# Patient Record
Sex: Female | Born: 1980 | Race: White | Hispanic: No | Marital: Married | Smoking: Never smoker
Health system: Southern US, Community
[De-identification: ages and names within clinical notes are randomized; demographics above are authoritative.]

## PROBLEM LIST (undated history)

## (undated) ENCOUNTER — Inpatient Hospital Stay (HOSPITAL_COMMUNITY): Payer: Self-pay

## (undated) DIAGNOSIS — I471 Supraventricular tachycardia, unspecified: Secondary | ICD-10-CM

## (undated) DIAGNOSIS — G809 Cerebral palsy, unspecified: Secondary | ICD-10-CM

## (undated) HISTORY — DX: Cerebral palsy, unspecified: G80.9

## (undated) HISTORY — PX: NO PAST SURGERIES: SHX2092

---

## 2013-04-07 ENCOUNTER — Emergency Department (HOSPITAL_COMMUNITY)
Admission: EM | Admit: 2013-04-07 | Discharge: 2013-04-08 | Disposition: A | Discharge status: Home / Self Care | Payer: 59 | Attending: Emergency Medicine | Admitting: Emergency Medicine

## 2013-04-07 ENCOUNTER — Emergency Department (HOSPITAL_COMMUNITY): Payer: 59

## 2013-04-07 ENCOUNTER — Encounter (HOSPITAL_COMMUNITY): Payer: Self-pay

## 2013-04-07 DIAGNOSIS — R002 Palpitations: Secondary | ICD-10-CM | POA: Insufficient documentation

## 2013-04-07 DIAGNOSIS — I471 Supraventricular tachycardia: Secondary | ICD-10-CM

## 2013-04-07 DIAGNOSIS — I498 Other specified cardiac arrhythmias: Secondary | ICD-10-CM | POA: Insufficient documentation

## 2013-04-07 HISTORY — DX: Supraventricular tachycardia, unspecified: I47.10

## 2013-04-07 HISTORY — DX: Supraventricular tachycardia: I47.1

## 2013-04-07 LAB — CBC WITH DIFFERENTIAL/PLATELET
Basophils Relative: 1 % (ref 0–1)
Eosinophils Absolute: 0.1 10*3/uL (ref 0.0–0.7)
MCH: 29.4 pg (ref 26.0–34.0)
MCHC: 33.8 g/dL (ref 30.0–36.0)
Neutrophils Relative %: 63 % (ref 43–77)
Platelets: 371 10*3/uL (ref 150–400)
RDW: 13.4 % (ref 11.5–15.5)

## 2013-04-07 LAB — BASIC METABOLIC PANEL
BUN: 14 mg/dL (ref 6–23)
GFR calc Af Amer: >90 mL/min (ref >90)
GFR calc non Af Amer: >90 mL/min (ref >90)
Potassium: 3.6 mEq/L (ref 3.5–5.1)
Sodium: 137 mEq/L (ref 135–145)

## 2013-04-07 LAB — POCT I-STAT TROPONIN I: Troponin i, poc: 0 ng/mL (ref 0.00–0.08)

## 2013-04-07 NOTE — ED Provider Notes (Signed)
CSN: [id number]     Arrival date & time 04/07/13  2113 History     First MD Initiated Contact with Patient 04/07/13 2116     Chief Complaint  Patient presents with  . Tachycardia   (Consider location/radiation/quality/duration/timing/severity/associated sxs/prior Treatment) The history is provided by the patient and medical records.   Pt presents to the ED via EMS following episode of elevated HR at 220.  Pt states she has these episodes intermittent, variable in length, without specific triggers.  This episode occurred while doing laundry at home and lasted approx 40 minutes with some mild lightheadedness.  No associated chest pain, SOB, nausea, vomiting, or diaphoresis.  Last episode prior to this one was approx 2 months ago and occurred wile running.  Pt recent relocated here and does not have a cardiologist in the area, but she did see one years ago at [facility].  She wore a cardiac monitor but had no recorded events.  Did recently travel-- pt just returned home yesterday from a recent road trip to [place] visiting family.  Pt also takes OCPs.  No prior hx of DVT or PE.  Past Medical History  Diagnosis Date  . SVT (supraventricular tachycardia)    No past surgical history on file. No family history on file. History  Substance Use Topics  . Smoking status: Not on file  . Smokeless tobacco: Not on file  . Alcohol Use: Not on file   OB History   Grav Para Term Preterm Abortions TAB SAB Ect Mult Living                 Review of Systems  Cardiovascular: Positive for palpitations.  All other systems reviewed and are negative.    Allergies  Review of patient's allergies indicates no known allergies.  Home Medications   Current Outpatient Rx  Name  Route  Sig  Dispense  Refill  . norethindrone-ethinyl estradiol-iron (ESTROSTEP FE,TILIA FE,TRI-LEGEST FE) 1-20/1-30/1-35 MG-MCG tablet   Oral   Take 1 tablet by mouth daily.          SpO2 99%  LMP 03/21/2013  Physical  Exam  Nursing note and vitals reviewed. Constitutional: She is oriented to person, place, and time. She appears well-developed and well-nourished. No distress.  HENT:  Head: Normocephalic and atraumatic.  Mouth/Throat: Oropharynx is clear and moist.  Eyes: Conjunctivae and EOM are normal. Pupils are equal, round, and reactive to light.  Neck: Normal range of motion. Neck supple.  Cardiovascular: Normal rate, regular rhythm and normal heart sounds.   No murmur heard. Pulmonary/Chest: Effort normal and breath sounds normal. No respiratory distress. She has no wheezes.  Musculoskeletal: Normal range of motion. She exhibits no edema.  Neurological: She is alert and oriented to person, place, and time.  Skin: Skin is warm. She is not diaphoretic.  Psychiatric: She has a normal mood and affect.    ED Course   Procedures (including critical care time)   Date: 04/07/2013  Rate: 95  Rhythm: normal sinus rhythm  QRS Axis: normal  Intervals: PR shortened  ST/T Wave abnormalities: normal  Conduction Disutrbances:none  Narrative Interpretation:   Old EKG Reviewed: none available   Labs Reviewed  CBC WITH DIFFERENTIAL  BASIC METABOLIC PANEL  D-DIMER, QUANTITATIVE  POCT I-STAT TROPONIN I   Dg Chest 2 View  04/07/2013   *RADIOLOGY REPORT*  Clinical Data: Tachycardia, shortness of breath  CHEST - 2 VIEW  Comparison: None.  Findings: The cardiac and mediastinal silhouettes are within normal  limits.  The lungs are normally inflated.  No airspace consolidation, pulmonary edema, pleural effusion, or pneumothorax is identified.  No osseous abnormality.  IMPRESSION:  Normal radiograph of the chest with no acute cardiopulmonary process identified.   Original Report Authenticated By: Rise Mu, M.D.   1. SVT (supraventricular tachycardia)     MDM   EKG NSR, minimally shortened PR interval at 118.  Trop negative.  CXR clear.  Labs all WNL.  Rhythm strip from EMS reviewed-- episode of  SVT.  These are likely the episodes pt has been feeling for the past several months.  I doubt ACS, PE, dissection, or other vascular compromise.  I have discussed results of labs tests and imaging with pt.  I feel she can be safely d/c with close cardiology FU-- will be referred to [facility] cardiology.  I have discussed this plan with pt, she acknowledges understanding and is comfortable with plan.  Return precautions advised.  Discussed with Dr. Juleen China who agrees with assessment and plan.  Garlon Hatchet, PA-C 04/08/13 [phone]

## 2013-04-07 NOTE — ED Notes (Signed)
Pt had episode of HR of 220 EMS and resure had pt do val salva man. And converted back to NSR. Pt alert and oriented on arrival to ED

## 2013-04-07 NOTE — ED Notes (Signed)
Pt states she has had 3 other episode in the past. Has seen a cariologist in the past but just moved here and does not know any one

## 2013-04-07 NOTE — ED Notes (Signed)
Denies and pain or shob states she feels back to normal.

## 2013-04-12 NOTE — ED Provider Notes (Signed)
Medical screening examination/treatment/procedure(s) were conducted as a shared visit with non-physician practitioner(s) and myself.  I personally evaluated the patient during the encounter.  Patient with palpitations. She has been previously evaluated without clear etiology. 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 This resolved with vagal maneuvers performed by EMS. Workup today has been unremarkable. Return precautions discussed. Cardiology followup.  Raeford Razor, MD 04/12/13 1145

## 2013-06-13 ENCOUNTER — Ambulatory Visit (INDEPENDENT_AMBULATORY_CARE_PROVIDER_SITE_OTHER): Payer: 59 | Admitting: Cardiovascular Disease

## 2013-06-13 ENCOUNTER — Encounter: Payer: Self-pay | Admitting: Cardiovascular Disease

## 2013-06-13 VITALS — BP 126/82 | HR 92 | Ht 64.0 in | Wt 112.8 lb

## 2013-06-13 DIAGNOSIS — I471 Supraventricular tachycardia: Secondary | ICD-10-CM | POA: Insufficient documentation

## 2013-06-13 DIAGNOSIS — I498 Other specified cardiac arrhythmias: Secondary | ICD-10-CM

## 2013-06-13 MED ORDER — PROPRANOLOL HCL 10 MG PO TABS: 10.0000 mg | ORAL_TABLET | Freq: Three times a day (TID) | ORAL | Status: DC

## 2013-06-13 NOTE — Progress Notes (Signed)
Patient ID: Debra King, female   DOB: 1981/04/29, 32 y.o.   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  Started in her 20's  Initially lasted minutes but over years getting longer.  Seen in ER 8/14 with presumed SVT per EMS field strip rate 220 bpm.  Broke in field with vagal maneuvers.  Never had chest pain dyspnea or syncope.  No pre-excitation  Has been seen at [facility] before and indicates normal thyroid and echo within the last year.  Only one cup of coffee / day.  Exercise does not exacerbate Can get episodes that wake her up at night. Had another episode in September  Can last close to an hour now.  No ETOH drugs or other stimulants.  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  All other systems reviewed and negative   General: Affect appropriate Healthy:  appears stated age HEENT: normal Neck supple with no adenopathy JVP normal no bruits no thyromegaly Lungs clear with no wheezing and good diaphragmatic motion Heart:  S1/S2 no murmur,rub, gallop or click PMI normal Abdomen: benighn, BS positve, no tenderness, no AAA no bruit.  No HSM or HJR Distal pulses intact with no bruits No edema Neuro non-focal Skin warm and dry No muscular weakness  Medications Current Outpatient Prescriptions  Medication Sig Dispense Refill  . norethindrone-ethinyl estradiol-iron (ESTROSTEP FE,TILIA FE,TRI-LEGEST FE) 1-20/1-30/1-35 MG-MCG tablet Take 1 tablet by mouth daily.      . propranolol (INDERAL) 10 MG tablet Take 1 tablet (10 mg total) by mouth 3 (three) times daily.  30 tablet  3   No current facility-administered medications for this visit.    Allergies Review of patient's allergies indicates no known allergies.  Family History: History reviewed. No  pertinent family history.  Social History: History   Social History  . Marital Status: Single    Spouse Name: N/A    Number of Children: N/A  . Years of Education: N/A   Occupational History  . Not on file.   Social History Main Topics  . Smoking status: Never Smoker   . Smokeless tobacco: Not on file  . Alcohol Use: Not on file  . Drug Use: No  . Sexual Activity: Not on file   Other Topics Concern  . Not on file   Social History Narrative  . No narrative on file    Electrocardiogram:  SR rate 92 normal ECG PR 114  Assessment and Plan

## 2013-06-13 NOTE — Patient Instructions (Addendum)
Your physician recommends that you schedule a follow-up appointment in: EP   NEXT AVAILABLE    DX  SVT YEAR WITH DR [facility] Your physician has recommended you make the following change in your medication:  INDERAL  10 MG  AS  NEEDED FOR PALPITATIONS

## 2013-06-13 NOTE — Assessment & Plan Note (Signed)
Discussed vagal maneuvers PRN inderal for attacks  Will refer to EP for further discussion She is not inconvenienced enough to want ablation at this point

## 2013-07-03 ENCOUNTER — Encounter: Payer: Self-pay | Admitting: Internal Medicine

## 2013-07-04 ENCOUNTER — Encounter: Payer: Self-pay | Admitting: Internal Medicine

## 2013-07-04 ENCOUNTER — Ambulatory Visit (INDEPENDENT_AMBULATORY_CARE_PROVIDER_SITE_OTHER): Payer: 59 | Admitting: Internal Medicine

## 2013-07-04 VITALS — BP 118/82 | HR 74 | Ht 63.0 in | Wt 115.4 lb

## 2013-07-04 DIAGNOSIS — I471 Supraventricular tachycardia: Secondary | ICD-10-CM

## 2013-07-04 DIAGNOSIS — I498 Other specified cardiac arrhythmias: Secondary | ICD-10-CM

## 2013-07-04 NOTE — Progress Notes (Signed)
ELECTROPHYSIOLOGY CONSULT NOTE  Patient ID: Debra King, MRN: [id number], DOB/AGE: 01-23-1981 32 y.o. Admit date: (Not on file) Date of Consult: 07/04/2013  Primary Physician: No PCP Per Patient Primary Cardiologist: PN  Chief Complaint: SVT   HPI Debra King is a 32 y.o. 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 These have responded to Valsalva. It has been recorded on one occasion with EMS. We will work on getting these strips.  She was seen at [facility] a year ago. An echo at that time was apparently normal.  She has noted no association to stimulants. They have been triggered during exercise as well as while sleeping.      Past Medical History  Diagnosis Date  . SVT (supraventricular tachycardia)     Started in her 20s  . Cerebral palsy     Involving left leg and arm      Surgical History: No past surgical history on file.   Home Meds: Prior to Admission medications   Medication Sig Start Date End Date Taking? Authorizing Provider  levonorgestrel-ethinyl estradiol (AVIANE,ALESSE,LESSINA) 0.1-20 MG-MCG tablet Take 1 tablet by mouth daily.   Yes Historical Provider, MD  propranolol (INDERAL) 10 MG tablet Take 10 mg by mouth as needed. 06/13/13  Yes Wendall Stade, MD      Allergies: No Known Allergies  History   Social History  . Marital Status: Single    Spouse Name: N/A    Number of Children: N/A  . Years of Education: N/A   Occupational History  . Not on file.   Social History Main Topics  . Smoking status: Never Smoker   . Smokeless tobacco: Not on file  . Alcohol Use: No  . Drug Use: No  . Sexual Activity: Not on file   Other Topics Concern  . Not on file   Social History Narrative  . No narrative on file     No family history on file.   ROS:  Please see the history of present  illness.   Negative except History of GI bleed  All other systems reviewed and negative.    Physical Exam:   Blood pressure 118/82, pulse 74, height 5\' 3"  (1.6 m), weight 115 lb 6.4 oz (52.345 kg), last menstrual period 07/03/2013. General: Well developed, well nourished female in no acute distress. Head: Normocephalic, atraumatic, sclera non-icteric, no xanthomas, nares are without discharge. EENT: normal Lymph Nodes:  none Back: without scoliosis/kyphosis , no CVA tendersness Neck: Negative for carotid bruits. JVD not elevated. Lungs: Clear bilaterally to auscultation without wheezes, rales, or rhonchi. Breathing is unlabored. Heart: RRR with loud  S1 S2. No  murmur , rubs, or gallops appreciated. Abdomen: Soft, non-tender, non-distended with normoactive bowel sounds. No hepatomegaly. No rebound/guarding. No obvious abdominal masses. Msk:  Strength and tone appear normal for age. Significant asymmetry with atrophy of the left arm particularly Extremities: No clubbing or cyanosis. No edema.  Distal pedal pulses are 2+ and equal bilaterally. Skin: Warm and Dry Neuro: Alert and oriented X 3. CN III-XII intact Grossly normal sensory and motor function . Psych:  Responds to questions appropriately with a normal affect.      Labs: Cardiac Enzymes No results found for this basename: CKTOTAL, CKMB, TROPONINI,  in the last 72 hours CBC Lab Results  Component Value Date   WBC 10.3 04/07/2013  HGB 13.3 04/07/2013   HCT 39.3 04/07/2013   MCV 86.9 04/07/2013   PLT 371 04/07/2013   PROTIME: No results found for this basename: LABPROT, INR,  in the last 72 hours Chemistry No results found for this basename: NA, K, CL, CO2, BUN, CREATININE, CALCIUM, LABALBU, PROT, BILITOT, ALKPHOS, ALT, AST, GLUCOSE,  in the last 168 hours Lipids No results found for this basename: CHOL,  HDL,  LDLCALC,  TRIG   BNP No results found for this basename: probnp   Miscellaneous Lab Results  Component Value Date    DDIMER <0.27 04/07/2013    Radiology/Studies:  No results found.  EKG:  No evidence of ventricular preexcitation There is evidence of an R. prime in lead V1 in sinus rhythm   Assessment and Plan:    Debra King

## 2013-07-04 NOTE — Assessment & Plan Note (Signed)
The patient has recurrent SVT. We will work on getting distress. By history he could either be AV reentry or AV nodal reentry. We discussed treatment options including when necessary beta blockers, daily medications, the use of adjunctive parasympathetic maneuvers as well as catheter ablation. We reviewed benefits and risks of the procedure including but not limited to stroke, death, and heart block.  For now she would like to do more research. We will continue her on when necessary beta blockers which were initiated by Dr. Jamse Mead. We will see her in 8 months time unless she would like to be seen earlier

## 2013-07-04 NOTE — Patient Instructions (Signed)
Your physician wants you to follow-up in: 8 months with Dr. Graciela Husbands.  You will receive a reminder letter in the mail two months in advance. If you don't receive a letter, please call our office to schedule the follow-up appointment.

## 2014-04-07 IMAGING — CR DG CHEST 2V
2 series · 2 of 2 positions shown · non-contrast
Comparison: None.

CLINICAL DATA: Tachycardia, shortness of breath

CHEST - 2 VIEW

[w chest pa]
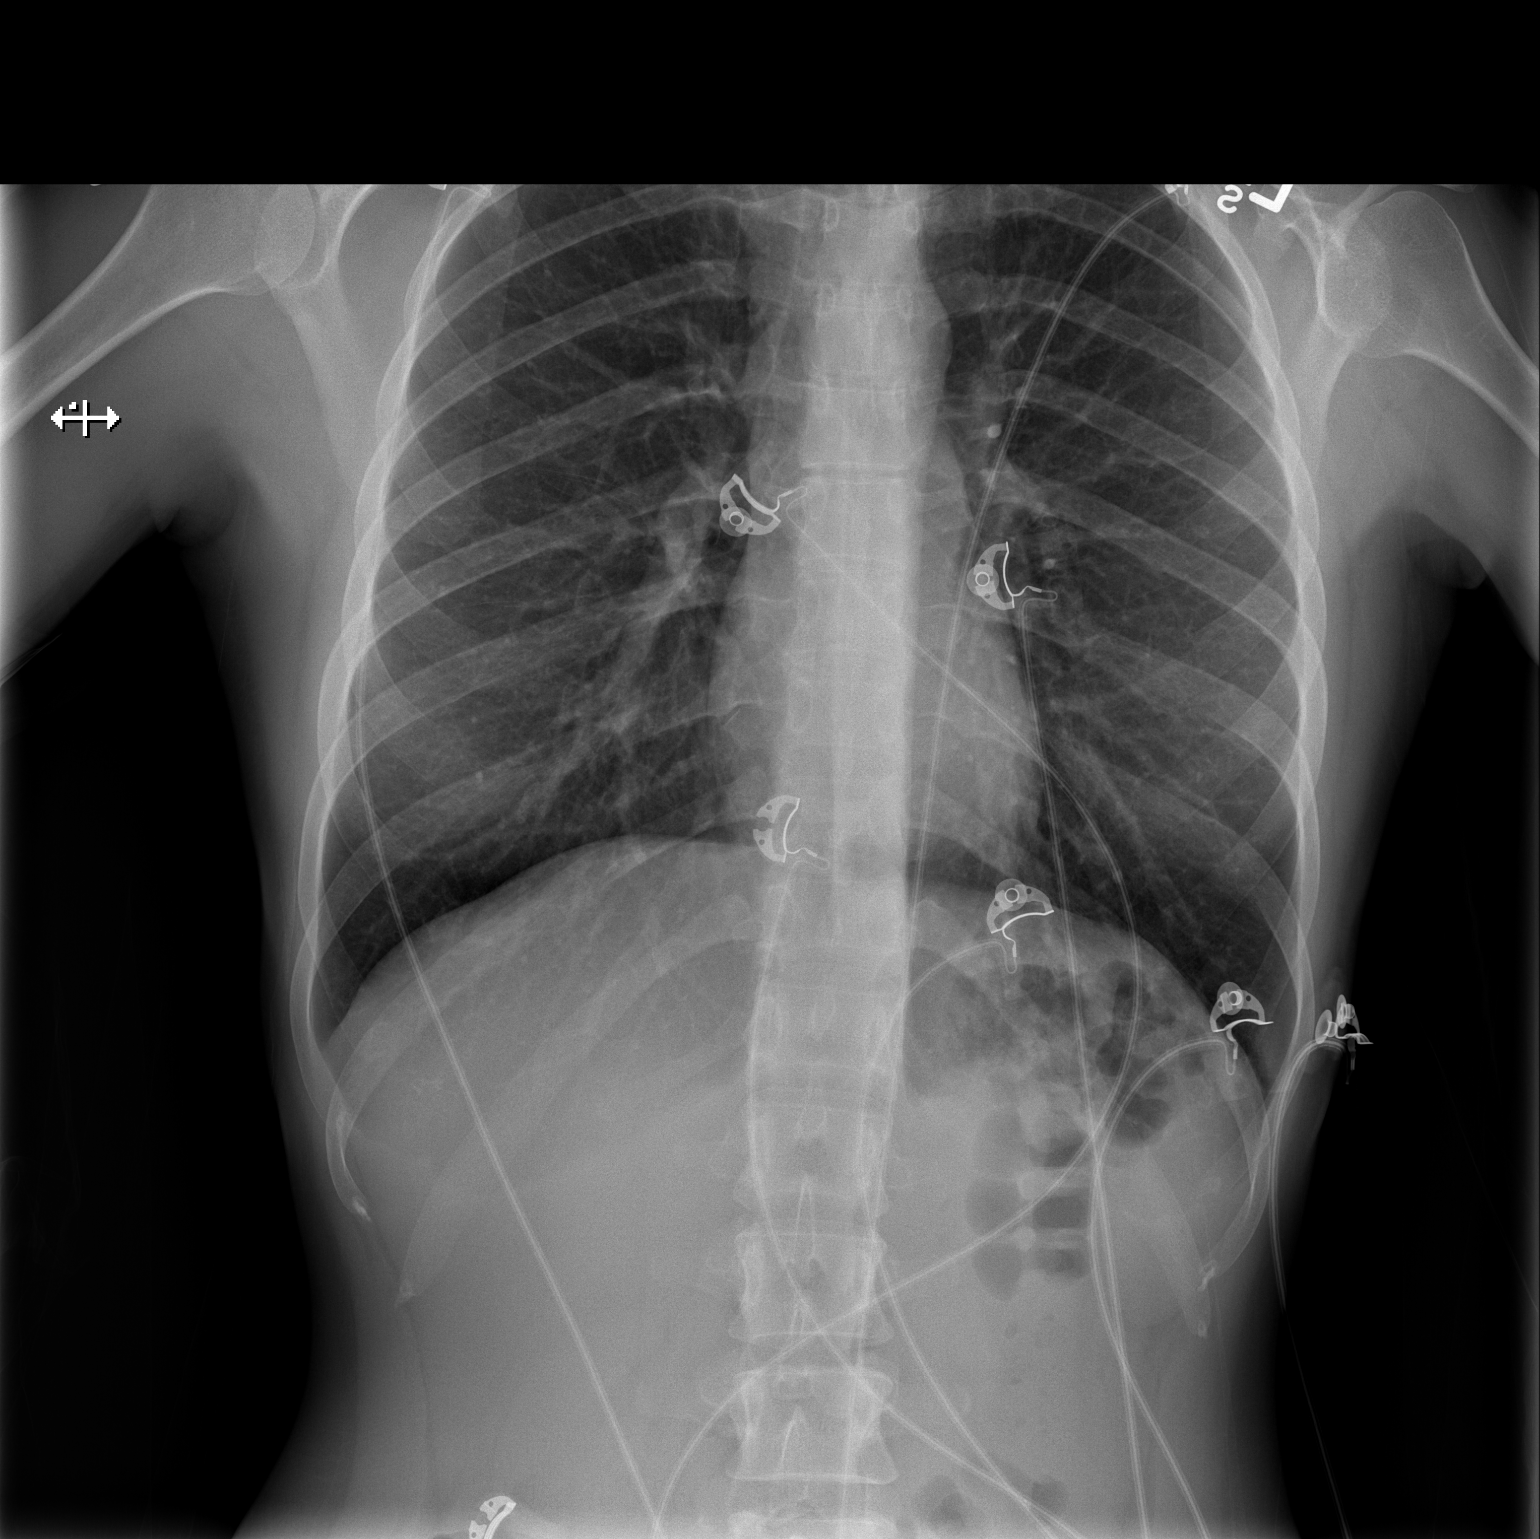

[w chest lat]
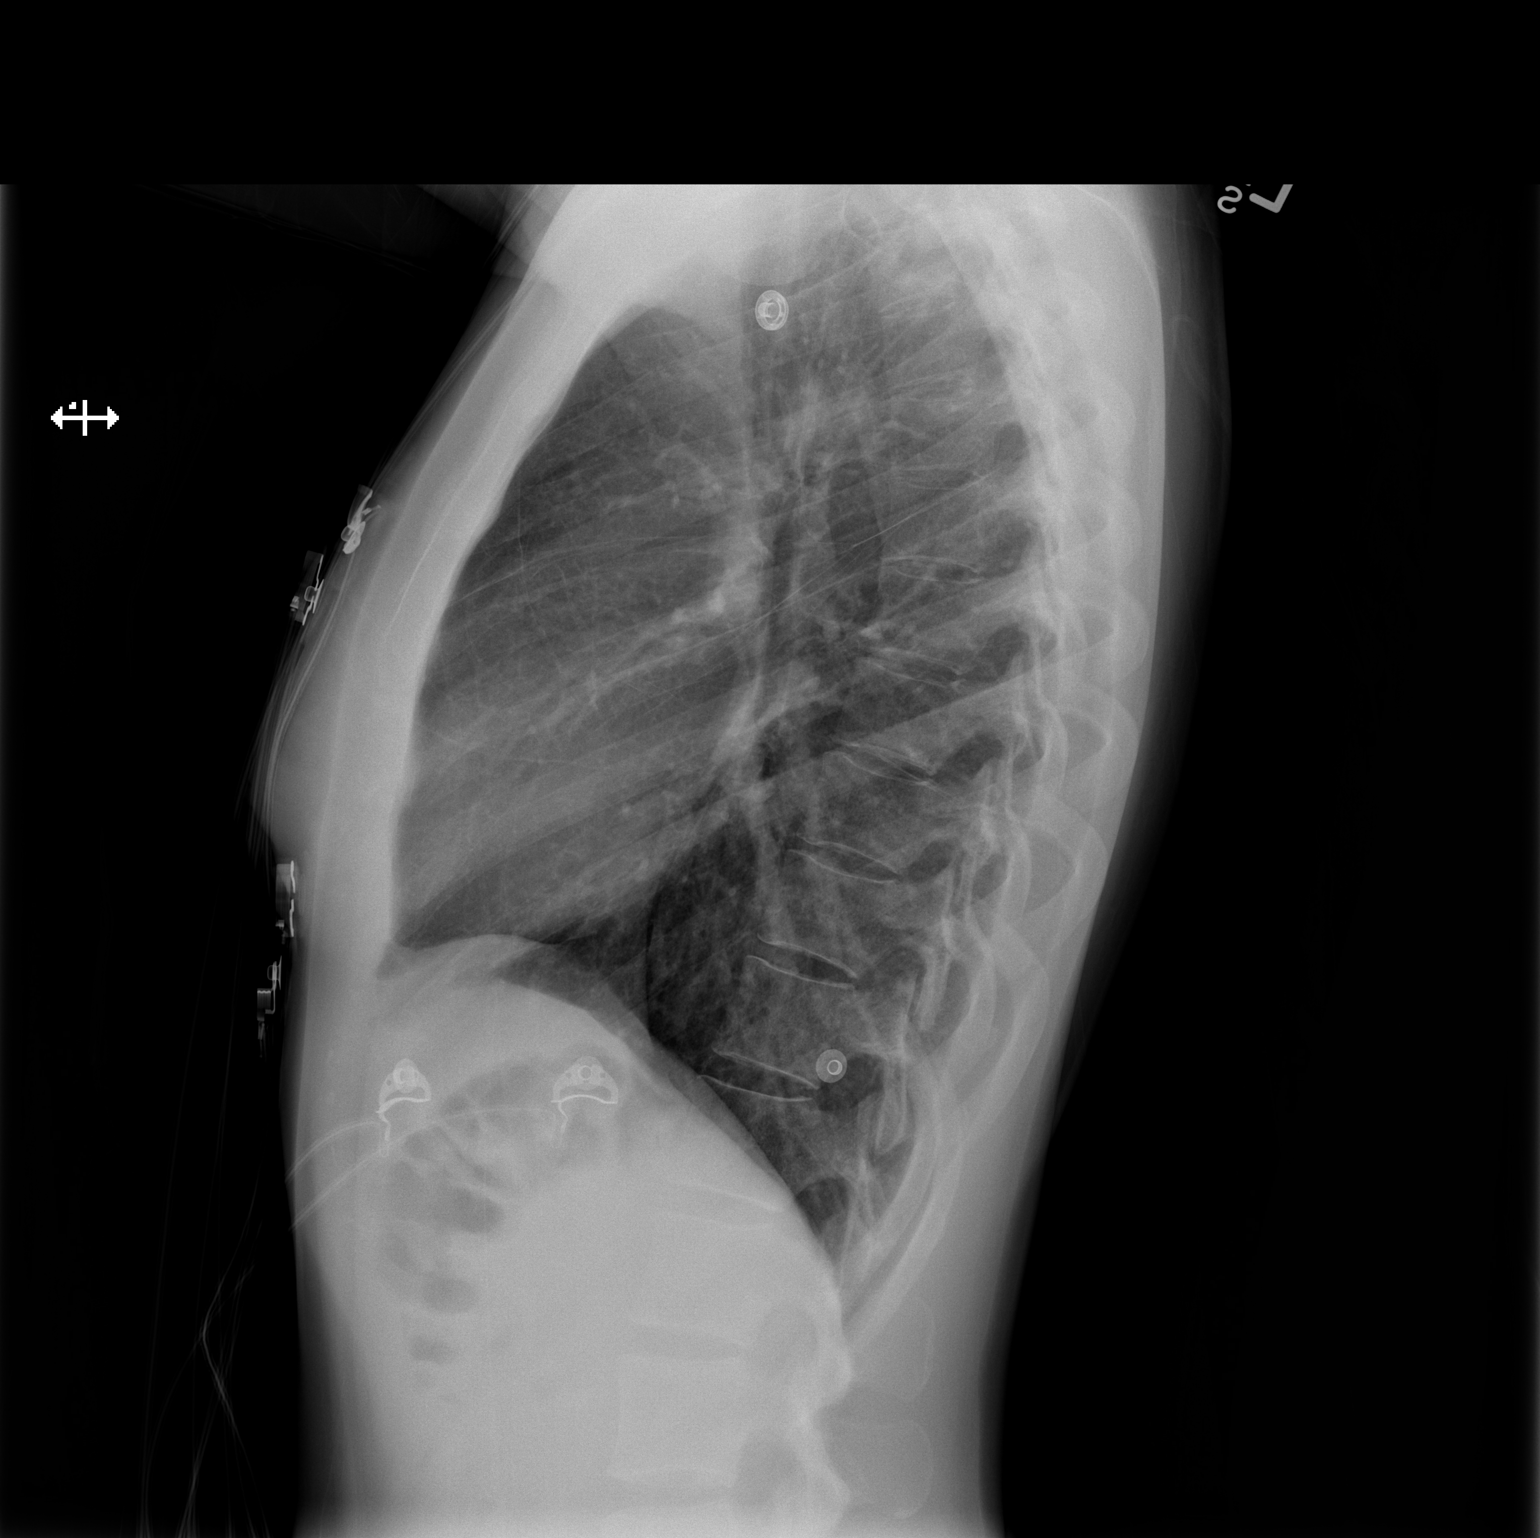

[2 of 2 positions shown; findings below may reference images not displayed]

FINDINGS: The cardiac and mediastinal silhouettes are within normal
limits.

The lungs are normally inflated.  No airspace consolidation,
pulmonary edema, pleural effusion, or pneumothorax is identified.

No osseous abnormality.
IMPRESSION: Normal radiograph of the chest with no acute cardiopulmonary
process identified.

## 2015-02-28 ENCOUNTER — Ambulatory Visit (INDEPENDENT_AMBULATORY_CARE_PROVIDER_SITE_OTHER): Payer: 59 | Admitting: Internal Medicine

## 2015-02-28 DIAGNOSIS — Z9189 Other specified personal risk factors, not elsewhere classified: Secondary | ICD-10-CM

## 2015-02-28 MED ORDER — ATOVAQUONE-PROGUANIL HCL 250-100 MG PO TABS: 1.0000 | ORAL_TABLET | Freq: Every day | ORAL | Status: DC

## 2015-02-28 MED ORDER — TYPHOID VACCINE PO CPDR: 1.0000 | DELAYED_RELEASE_CAPSULE | ORAL | Status: DC

## 2015-02-28 MED ORDER — CIPROFLOXACIN HCL 500 MG PO TABS: 500.0000 mg | ORAL_TABLET | Freq: Two times a day (BID) | ORAL | Status: DC

## 2015-02-28 NOTE — Progress Notes (Signed)
   Subjective:    Patient ID: Debra King, female    DOB: 26-Oct-1980, 34 y.o.   MRN: [id number]  HPI  34yo F in good state of health, with previous travel to [place] will be returning in July 16 thru Aug 2nd for visiting friends, attending wedding. She will be traveling with her boyfriend. She goes to [place] every 2 years. Mostly will be in [place].   Previous vax: hep A #1 in dec 2015, typhoid in 2007. Has had yellow fever vaccine. No flu vaccine last year.  Previous travel: [place], [place], [place], [address], [place], [place], [place], [place], [place], [place]  No Known Allergies Current Outpatient Prescriptions on File Prior to Visit  Medication Sig Dispense Refill  . levonorgestrel-ethinyl estradiol (AVIANE,ALESSE,LESSINA) 0.1-20 MG-MCG tablet Take 1 tablet by mouth daily.    . propranolol (INDERAL) 10 MG tablet Take 10 mg by mouth as needed.     No current facility-administered medications on file prior to visit.   Active Ambulatory Problems    Diagnosis Date Noted  . SVT (supraventricular tachycardia)    Resolved Ambulatory Problems    Diagnosis Date Noted  . No Resolved Ambulatory Problems   Past Medical History  Diagnosis Date  . Cerebral palsy      Review of Systems     Objective:   Physical Exam        Assessment & Plan:  She is up to date on her travel vaccines, except for oral typhoid. Gave vivotif rx  Malaria proph = gave malarone and mosquito bite prevention recs  Traveler's diarrhea  = gave precautions and rx for cipro to use if needed

## 2015-03-04 ENCOUNTER — Telehealth: Payer: Self-pay | Admitting: *Deleted

## 2015-03-04 NOTE — Telephone Encounter (Signed)
Patient experiencing a rash.  Per Dr. Snider, sent STAT referral to dermatology in [place].  [facility]abDrue [facility] (pt will see SunGardmber Register, PA-C) on 6/30 at 9:20.  Address of office is [id number] [place] [address] phone: [phone].  RN left message on patient's voicemail with appointment date, time, location.  Asked her to contact us with questions. Andree CossHowell, Hudsyn Champine M, RN

## 2015-03-06 ENCOUNTER — Other Ambulatory Visit: Payer: Self-pay | Admitting: Internal Medicine

## 2015-03-06 DIAGNOSIS — Z9189 Other specified personal risk factors, not elsewhere classified: Secondary | ICD-10-CM

## 2015-03-06 MED ORDER — CIPROFLOXACIN HCL 500 MG PO TABS: 500.0000 mg | ORAL_TABLET | Freq: Two times a day (BID) | ORAL | Status: DC

## 2015-03-11 ENCOUNTER — Ambulatory Visit: Payer: 59

## 2015-04-09 ENCOUNTER — Other Ambulatory Visit: Payer: Self-pay | Admitting: Cardiovascular Disease

## 2015-04-10 NOTE — Telephone Encounter (Signed)
Please review this request for Inderal 10 mg.  She has not been seen since 2014.  I did not feel comfortable refilling this med.  Thanks.

## 2015-04-10 NOTE — Telephone Encounter (Signed)
x

## 2015-04-11 NOTE — Telephone Encounter (Signed)
Ok to refill inderal

## 2015-04-14 ENCOUNTER — Other Ambulatory Visit: Payer: Self-pay

## 2015-06-02 ENCOUNTER — Encounter: Payer: Self-pay | Admitting: Cardiovascular Disease

## 2015-06-02 NOTE — Progress Notes (Signed)
Patient ID: Debra King, female   DOB: 1981-05-22, 34 y.o.   MRN: [id number] 34 y.o.  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  Started in her 20's  Initially lasted minutes but over years getting longer.  Seen in ER 8/14 with presumed SVT per EMS field strip rate 220 bpm.  Broke in field with vagal maneuvers.  Never had chest pain dyspnea or syncope.  No pre-excitation  Has been seen at [facility] before and indicates normal thyroid and echo within the last year.  Only one cup of coffee / day.  Exercise does not exacerbate Can get episodes that wake her up at night. Had another episode in September  Can last close to an hour now.  No ETOH drugs or other stimulants.  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  Going well  ROS: Denies fever, malais, weight loss, blurry vision, decreased visual acuity, cough, sputum, SOB, hemoptysis, pleuritic pain, palpitaitons, heartburn, abdominal pain, melena, lower extremity edema, claudication, or rash.  All other systems reviewed and negative   General: Affect appropriate Healthy:  appears stated age HEENT: normal Neck supple with no adenopathy JVP normal no bruits no thyromegaly Lungs clear with no wheezing and good diaphragmatic motion Heart:  S1/S2 no murmur,rub, gallop or click PMI normal Abdomen: benighn, BS positve, no tenderness, no AAA no bruit.  No HSM or HJR Distal pulses intact with no bruits No edema Neuro non-focal Skin warm and dry No muscular weakness  Medications Current Outpatient Prescriptions  Medication Sig Dispense Refill  . levonorgestrel-ethinyl estradiol (AVIANE,ALESSE,LESSINA) 0.1-20 MG-MCG tablet Take 1 tablet by mouth daily.    . propranolol (INDERAL) 10 MG tablet Take 10 mg by mouth daily as needed. For SVT  1   No current facility-administered medications for this visit.    Allergies Review of  patient's allergies indicates no known allergies.  Family History: Family History  Problem Relation Age of Onset  . Family history unknown: Yes    Social History: Social History   Social History  . Marital Status: Single    Spouse Name: N/A  . Number of Children: N/A  . Years of Education: N/A   Occupational History  . Not on file.   Social History Main Topics  . Smoking status: Never Smoker   . Smokeless tobacco: Not on file  . Alcohol Use: No  . Drug Use: No  . Sexual Activity: Not on file   Other Topics Concern  . Not on file   Social History Narrative    Electrocardiogram:  SR rate 92 normal ECG PR 114  06/03/15  SR rate 76 short PR otherwise normal   Assessment and Plan  SVT:  Quiescent  Continue PRN Inderal.  F/u in a year

## 2015-06-03 ENCOUNTER — Ambulatory Visit (INDEPENDENT_AMBULATORY_CARE_PROVIDER_SITE_OTHER): Payer: 59 | Admitting: Cardiovascular Disease

## 2015-06-03 ENCOUNTER — Encounter: Payer: Self-pay | Admitting: Cardiovascular Disease

## 2015-06-03 VITALS — BP 130/80 | HR 76 | Ht 63.0 in | Wt 128.8 lb

## 2015-06-03 DIAGNOSIS — I471 Supraventricular tachycardia: Secondary | ICD-10-CM

## 2015-06-03 NOTE — Patient Instructions (Signed)
Medication Instructions:  Your physician recommends that you continue on your current medications as directed. Please refer to the Current Medication list given to you today.   Labwork: NONE Testing/Procedures: NONE  Follow-Up: Your physician wants you to follow-up in: YEAR WITH  DR NISHAN  You will receive a reminder letter in the mail two months in advance. If you don't receive a letter, please call our office to schedule the follow-up appointment.  Any Other Special Instructions Will Be Listed Below (If Applicable).   

## 2016-04-19 DIAGNOSIS — Z23 Encounter for immunization: Secondary | ICD-10-CM | POA: Diagnosis not present

## 2016-04-19 DIAGNOSIS — E538 Deficiency of other specified B group vitamins: Secondary | ICD-10-CM | POA: Diagnosis not present

## 2016-04-19 DIAGNOSIS — R5383 Other fatigue: Secondary | ICD-10-CM | POA: Diagnosis not present

## 2016-04-19 DIAGNOSIS — G479 Sleep disorder, unspecified: Secondary | ICD-10-CM | POA: Diagnosis not present

## 2016-05-05 DIAGNOSIS — R0681 Apnea, not elsewhere classified: Secondary | ICD-10-CM | POA: Diagnosis not present

## 2016-06-15 DIAGNOSIS — Z3041 Encounter for surveillance of contraceptive pills: Secondary | ICD-10-CM | POA: Diagnosis not present

## 2016-06-15 DIAGNOSIS — Z6822 Body mass index (BMI) 22.0-22.9, adult: Secondary | ICD-10-CM | POA: Diagnosis not present

## 2016-06-15 DIAGNOSIS — Z130 Encounter for screening for diseases of the blood and blood-forming organs and certain disorders involving the immune mechanism: Secondary | ICD-10-CM | POA: Diagnosis not present

## 2016-06-15 DIAGNOSIS — Z1389 Encounter for screening for other disorder: Secondary | ICD-10-CM | POA: Diagnosis not present

## 2016-06-15 DIAGNOSIS — Z793 Long term (current) use of hormonal contraceptives: Secondary | ICD-10-CM | POA: Diagnosis not present

## 2016-06-15 DIAGNOSIS — Z01419 Encounter for gynecological examination (general) (routine) without abnormal findings: Secondary | ICD-10-CM | POA: Diagnosis not present

## 2016-08-10 DIAGNOSIS — E785 Hyperlipidemia, unspecified: Secondary | ICD-10-CM | POA: Diagnosis not present

## 2016-08-10 DIAGNOSIS — Z0000 Encounter for general adult medical examination without abnormal findings: Secondary | ICD-10-CM | POA: Diagnosis not present

## 2016-08-10 DIAGNOSIS — Z131 Encounter for screening for diabetes mellitus: Secondary | ICD-10-CM | POA: Diagnosis not present

## 2016-08-10 DIAGNOSIS — Z23 Encounter for immunization: Secondary | ICD-10-CM | POA: Diagnosis not present

## 2017-08-02 DIAGNOSIS — Z01419 Encounter for gynecological examination (general) (routine) without abnormal findings: Secondary | ICD-10-CM | POA: Diagnosis not present

## 2017-08-02 DIAGNOSIS — Z1389 Encounter for screening for other disorder: Secondary | ICD-10-CM | POA: Diagnosis not present

## 2017-08-02 DIAGNOSIS — Z793 Long term (current) use of hormonal contraceptives: Secondary | ICD-10-CM | POA: Diagnosis not present

## 2017-08-02 DIAGNOSIS — Z6823 Body mass index (BMI) 23.0-23.9, adult: Secondary | ICD-10-CM | POA: Diagnosis not present

## 2017-08-02 DIAGNOSIS — G809 Cerebral palsy, unspecified: Secondary | ICD-10-CM | POA: Diagnosis not present

## 2017-08-11 ENCOUNTER — Ambulatory Visit: Payer: BLUE CROSS/BLUE SHIELD | Admitting: Occupational Therapy

## 2017-08-15 ENCOUNTER — Ambulatory Visit: Payer: BLUE CROSS/BLUE SHIELD | Admitting: Physical Therapy

## 2017-08-17 ENCOUNTER — Encounter: Payer: Self-pay | Admitting: Physical Therapy

## 2017-08-17 ENCOUNTER — Ambulatory Visit: Payer: BLUE CROSS/BLUE SHIELD | Attending: Obstetrics and Gynecology | Admitting: Physical Therapy

## 2017-08-17 ENCOUNTER — Other Ambulatory Visit: Payer: Self-pay

## 2017-08-17 DIAGNOSIS — E785 Hyperlipidemia, unspecified: Secondary | ICD-10-CM | POA: Diagnosis not present

## 2017-08-17 DIAGNOSIS — G8929 Other chronic pain: Secondary | ICD-10-CM | POA: Diagnosis not present

## 2017-08-17 DIAGNOSIS — M542 Cervicalgia: Secondary | ICD-10-CM

## 2017-08-17 DIAGNOSIS — R2689 Other abnormalities of gait and mobility: Secondary | ICD-10-CM | POA: Insufficient documentation

## 2017-08-17 DIAGNOSIS — Z0000 Encounter for general adult medical examination without abnormal findings: Secondary | ICD-10-CM | POA: Diagnosis not present

## 2017-08-17 DIAGNOSIS — G809 Cerebral palsy, unspecified: Secondary | ICD-10-CM | POA: Insufficient documentation

## 2017-08-17 DIAGNOSIS — R293 Abnormal posture: Secondary | ICD-10-CM | POA: Diagnosis not present

## 2017-08-17 DIAGNOSIS — R29818 Other symptoms and signs involving the nervous system: Secondary | ICD-10-CM | POA: Diagnosis not present

## 2017-08-17 DIAGNOSIS — Z23 Encounter for immunization: Secondary | ICD-10-CM | POA: Diagnosis not present

## 2017-08-17 DIAGNOSIS — M25512 Pain in left shoulder: Secondary | ICD-10-CM | POA: Insufficient documentation

## 2017-08-17 NOTE — Therapy (Signed)
[facility] [address] [place], [place], [address] Phone: [phone]   Fax:  [phone]  Physical Therapy Evaluation  Patient Details  Name: Debra King MRN: [id number] Date of Birth: 05-26-1981 Referring Provider: Sherian Rein, MD   Encounter Date: 08/17/2017  PT End of Session - 08/17/17 1652    Visit Number  1    Number of Visits  13    Date for PT Re-Evaluation  11/15/17    Authorization Type  BCBS    PT Start Time  1530    PT Stop Time  1620    PT Time Calculation (min)  50 min    Activity Tolerance  Patient tolerated treatment well    Behavior During Therapy  [facility] for tasks assessed/performed       Past Medical History:  Diagnosis Date  . Cerebral palsy (HCC)    Involving left leg and arm  . SVT (supraventricular tachycardia) (HCC)    Started in her 89s    History reviewed. No pertinent surgical history.  There were no vitals filed for this visit.   Subjective Assessment - 08/17/17 1534    Subjective  Pt referred to PT for core strengthening by OBGYN as pt is beginning to plan for pregnancy.  Pt reports she has not been to PT since she was 36 years old.  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  LUE has become more tense over the past couple of years.    Pertinent History  CP with L hemiplegia, SVT.  No sugeries as a child or adult.      Patient Stated Goals  Finding ways to work with her body and get out of bad habits.    Currently in Pain?  Yes    Pain Location  Shoulder    Pain Orientation  Right;Left    Pain Descriptors / Indicators  Aching;Sore;Tightness    Pain Type  Chronic pain    Pain Onset  More than a month ago    Pain Relieving Factors  massages    Multiple Pain Sites  Yes    Pain Location  Neck    Pain Orientation  Right;Left    Pain Descriptors / Indicators   Tightness;Sore    Pain Type  Chronic pain    Pain Onset  More than a month ago    Aggravating Factors   sometimes wakes up with sudden neck spasms and soreness on L side         [facility] PT Assessment - 08/17/17 1546      Assessment   Medical Diagnosis  L hemiparesis, CP    Referring Provider  Sherian Rein, MD    Onset Date/Surgical Date  08/02/17    Hand Dominance  Right    Prior Therapy  until she was 36 years old      Precautions   Precautions  Other (comment)    Precaution Comments  CP, SVT      Balance Screen   Has the patient fallen in the past 6 months  No    Has the patient had a decrease in activity level because of a fear of falling?   Yes    Is the patient reluctant to leave their home because of a fear of falling?   No      Home Environment   Living  Environment  Private residence    Living Arrangements  Spouse/significant other    Type of Home  House    Home Access  Stairs to enter    Entrance Stairs-Number of Steps  3    Home Layout  Two level    Home Equipment  None      Prior Function   Level of Independence  Independent    Vocation  Full time employment    Vocation Requirements  professor of [employer]    Leisure  Swimming; doesn't typically do group based exercise classes, walks her dog.  Has a busy work schedule      Presenter, broadcastingensation   Light Touch  Appears Intact      Posture/Postural Control   Posture/Postural Control  Postural limitations    Posture Comments  In sitting: L shoulder elevated with upper trap hypertrophy, L scapular winging, L trunk shortening.  In standing: Pt presents with slightly forward head, L shoulder rounded anteriorly, leaning to L side with L trunk shortening.  Pt able to actively tilt pelvis anterior, posterior, laterally but with decreased excursion to R side.      Tone   Assessment Location  Left Lower Extremity      ROM / Strength   AROM / PROM / Strength  PROM      PROM   Overall PROM   Deficits     Overall PROM Comments  LLE: ankle DF to 0 deg neutral; 65 deg hamstring flexion.  RLE: limited into hip IR      Flexibility   Soft Tissue Assessment /Muscle Length  yes    Hamstrings  75 deg flexion with knee straight RLE; LLE: 65 deg       Ambulation/Gait   Ambulation/Gait  Yes    Ambulation/Gait Assistance  6: Modified independent (Device/Increase time)    Ambulation Distance (Feet)  300 Feet    Assistive device  None    Gait Pattern  Step-through pattern;Decreased arm swing - left;Decreased hip/knee flexion - left;Decreased dorsiflexion - left;Lateral trunk lean to left;Poor foot clearance - left caught L toes on floor once    Ambulation Surface  Level;Indoor    Stairs  Yes    Stairs Assistance  6: Modified independent (Device/Increase time)    Stair Management Technique  No rails;Alternating pattern;Forwards leans to L descending, circumducts LLE when ascending    Number of Stairs  8    Height of Stairs  6      LLE Tone   LLE Tone  Mild;Hypertonic             Objective measurements completed on examination: See above findings.              PT Education - 08/17/17 1651    Education provided  Yes    Education Details  clinical findings, PT POC, goals, treatment interventions    Person(s) Educated  Patient    Methods  Explanation    Comprehension  Verbalized understanding       PT Short Term Goals - 08/17/17 1704      PT SHORT TERM GOAL #1   Title  Pt will participate in further balance and gait assessment with FGA     Time  6    Period  Weeks    Status  New    Target Date  10/01/17      PT SHORT TERM GOAL #2   Title  Pt will report 25% less pain in bilat  shoulders and neck     Baseline  constant, daily     Time  6    Period  Weeks    Status  New    Target Date  10/01/17      PT SHORT TERM GOAL #3   Title  Pt will demonstrate improved posture with more symmetrical trunk and shoulders and decreased L scapular winging    Time  6    Period  Weeks     Status  New    Target Date  10/01/17      PT SHORT TERM GOAL #4   Title  Pt will demonstrate 10 deg increase in L ankle DF ROM and L hamstring flexion ROM    Baseline  65 deg hamstring flexion, 0 deg ankle DF    Time  6    Period  Weeks    Status  New    Target Date  10/01/17        PT Long Term Goals - 08/17/17 1708      PT LONG TERM GOAL #1   Title  Pt will demonstrate independence with HEP and compliance with community wellness plan    Time  12    Period  Weeks    Status  New    Target Date  11/15/17      PT LONG TERM GOAL #2   Title  Pt will demonstrate improved balance during gait with 8 point increase in FGA     Baseline  TBD    Time  12    Period  Weeks    Status  New    Target Date  11/15/17      PT LONG TERM GOAL #3   Title  Pt will report 50% decrease in daily pain in shoulders and neck     Time  12    Period  Weeks    Status  New    Target Date  11/15/17      PT LONG TERM GOAL #4   Title  Pt will demonstrate improved posture in sitting/standing with decreased forward head, rounded shoulders, L scapular winging and L elevated shoulder with more symmetrical trunk/rib positions    Time  12    Period  Weeks    Status  New    Target Date  11/15/17      PT LONG TERM GOAL #5   Title  Pt will ascend and descend stairs with alternating sequence, no rail with decreased circumduction when ascending and decreased L trunk lean when descending    Time  12    Period  Weeks    Status  New    Target Date  11/15/17             Plan - 08/17/17 1657    Clinical Impression Statement  Pt is a 36 year old female referred to Neuro OPPT for evaluation of muscle weakness due to congenital cerebral palsy and L hemiparesis.  Pt's PMH is significant for the following: SVT, CP with L hemiparesis without any surgical intervention.  Pt currently does not use an AD or orthoses.  The following deficits were noted during pt's exam: impaired ROM with increased mm tightness,  hypertonicity, increased neck and shoulder pain, impaired posture and gait.  Pt would benefit from skilled PT to address these impairments and functional limitations to maximize functional mobility independence and reduce falls risk.    History and Personal Factors relevant to plan of care:  independent  but is more sedentary due to vocation, CP with L hemiparesis, SVT, pain    Clinical Presentation  Stable    Clinical Presentation due to:  independent but is more sedentary due to vocation, CP with L hemiparesis, SVT, pain    Clinical Decision Making  Low    Rehab Potential  Good    PT Frequency  Other (comment) 1-2x/week based on work schedule    PT Duration  12 weeks    PT Treatment/Interventions  ADLs/Self Care Home Management;Aquatic Therapy;Electrical Stimulation;Moist Heat;Cryotherapy;Gait training;Stair training;Functional mobility training;Therapeutic activities;Therapeutic exercise;Balance training;Neuromuscular re-education;Patient/family education;Manual techniques;Passive range of motion;Dry needling;Taping    PT Next Visit Plan  further assess balance with gait: FGA.  Initiate stretching HEP for L gastroc, hamstring, R hip external rotator mm groups.  Core strength/training.  Dry needling L upper trap.  Bioness if appropriate LLE - DF, hamstring    PT Home Exercise Plan  pt enjoys swimming; pilates?    Recommended Other Services  dry needling    Consulted and Agree with Plan of Care  Patient       Patient will benefit from skilled therapeutic intervention in order to improve the following deficits and impairments:  Abnormal gait, Decreased range of motion, Impaired tone, Impaired UE functional use, Postural dysfunction, Pain  Visit Diagnosis: Cerebral palsy, unspecified type (HCC)  Other symptoms and signs involving the nervous system  Other abnormalities of gait and mobility  Chronic left shoulder pain  Cervicalgia  Abnormal posture     Problem List Patient Active  Problem List   Diagnosis Date Noted  . SVT (supraventricular tachycardia) (HCC)     Dierdre HighmanAudra F Keary Hanak, PT, DPT 08/17/17    5:14 PM    [facility] [address] [place], [place], [address] Phone: [phone]   Fax:  [phone]  Name: Debra King MRN: [id number] Date of Birth: 04-09-1981

## 2017-09-06 NOTE — L&D Delivery Note (Signed)
Delivery Note Pt reached complete dilation and the FHR began to have deeper variable decelerations so she was instructed on pushing.  She pushed great for 20 minutes and FHR recovered from decelerations between contractions.  At 9:09 PM a healthy female was delivered via Vaginal, Spontaneous (Presentation: LOA  ).  APGAR:7 ,9 ; weight pending .   Placenta status: delivered spontaneously .  Cord:  with the following complications:tight nuchal delivered through.  Delayed cord clamping for over one minute.    Anesthesia:  Epidural Episiotomy: None Lacerations:  Second degree Suture Repair: 3.0 vicryl rapide Est. Blood Loss (mL):   Mom to postpartum.  Baby to Couplet care / Skin to Skin. D/w pt circumcision and parents decline.  Debra King 07/11/2018, 9:30 PM

## 2017-09-12 ENCOUNTER — Ambulatory Visit: Payer: BLUE CROSS/BLUE SHIELD | Attending: Obstetrics and Gynecology | Admitting: Physical Therapy

## 2017-09-12 ENCOUNTER — Encounter: Payer: Self-pay | Admitting: Physical Therapy

## 2017-09-12 DIAGNOSIS — M25512 Pain in left shoulder: Secondary | ICD-10-CM | POA: Diagnosis not present

## 2017-09-12 DIAGNOSIS — R2689 Other abnormalities of gait and mobility: Secondary | ICD-10-CM | POA: Diagnosis not present

## 2017-09-12 DIAGNOSIS — G8929 Other chronic pain: Secondary | ICD-10-CM | POA: Diagnosis not present

## 2017-09-12 DIAGNOSIS — R29818 Other symptoms and signs involving the nervous system: Secondary | ICD-10-CM | POA: Diagnosis not present

## 2017-09-12 DIAGNOSIS — R293 Abnormal posture: Secondary | ICD-10-CM | POA: Diagnosis not present

## 2017-09-12 DIAGNOSIS — M542 Cervicalgia: Secondary | ICD-10-CM | POA: Diagnosis not present

## 2017-09-12 DIAGNOSIS — G809 Cerebral palsy, unspecified: Secondary | ICD-10-CM | POA: Diagnosis not present

## 2017-09-12 NOTE — Patient Instructions (Signed)
Adductor Stretch, Reclined (Strap, Wall)    Warm up with leg vertical. Rotate leg to side and fix foot to wall. Anchor opposite hip. Hold for _3-4___ breaths.  Bring leg back to middle and then across the body until you feel a stretch in your hip.  Hold 3-4 breaths. Repeat __2__ times each leg.  Copyright  VHI. All rights reserved.  Hamstring Stretch (Sitting)    Sitting, extend one leg and place hands on same thigh for support. Keeping torso straight, lean forward, sliding hands down leg, until a stretch is felt in back of thigh. Hold ____ seconds. Repeat with other leg.  Side Waist Stretch from Child's Pose    From child's pose, walk hands to left. Reach right hand out on diagonal. Reach hips back toward heels making a C with torso. Breathe into right side waist. Hold for __3-4__ breaths.  Walk hands to other side; try not to rotate to right. Hold 3-4 breaths. Repeat __2-3__ times each side.  Copyright  VHI. All rights reserved.

## 2017-09-12 NOTE — Therapy (Addendum)
[facility] [address] [place], [place], [address] Phone: [phone]   Fax:  [phone]  Physical Therapy Treatment  Patient Details  Name: Debra King MRN: [id number] Date of Birth: 08/29/1981 Referring Provider: Sherian ReinJody Bovard Stuckert, MD   Encounter Date: 09/12/2017  PT End of Session - 09/12/17 1221    Visit Number  2    Number of Visits  13    Date for PT Re-Evaluation  11/15/17    Authorization Type  BCBS    PT Start Time  1020    PT Stop Time  1105    PT Time Calculation (min)  45 min    Activity Tolerance  Patient tolerated treatment well    Behavior During Therapy  [facility] for tasks assessed/performed       Past Medical History:  Diagnosis Date  . Cerebral palsy (HCC)    Involving left leg and arm  . SVT (supraventricular tachycardia) (HCC)    Started in her 620s    History reviewed. No pertinent surgical history.  There were no vitals filed for this visit.  Subjective Assessment - 09/12/17 1024    Subjective  Doing well[place] [facility] [place] [facility] some soreness in her back afterwards; takes about 2 days for her back to relax afterwards.  Begins teaching again tomorrow.    Pertinent History  CP with L hemiplegia, SVT.  No sugeries as a child or adult.      Patient Stated Goals  Finding ways to work with her body and get out of bad habits.    Currently in Pain?  No/denies    Pain Onset  More than a month ago    Pain Onset  More than a month ago         [facility] PT Assessment - 09/12/17 1026      Functional Gait  Assessment   Gait assessed   Yes    Gait Level Surface  Walks 20 ft in less than 5.5 sec, no assistive devices, good speed, no evidence for imbalance, normal gait pattern, deviates no more than 6 in outside of the 12 in walkway width.    Change in Gait Speed  Able to smoothly change walking speed without loss of balance or gait deviation. Deviate no more than 6 in outside of the 12 in  walkway width.    Gait with Horizontal Head Turns  Performs head turns smoothly with no change in gait. Deviates no more than 6 in outside 12 in walkway width    Gait with Vertical Head Turns  Performs head turns with no change in gait. Deviates no more than 6 in outside 12 in walkway width.    Gait and Pivot Turn  Pivot turns safely within 3 sec and stops quickly with no loss of balance.    Step Over Obstacle  Is able to step over 2 stacked shoe boxes taped together (9 in total height) without changing gait speed. No evidence of imbalance.    Gait with Narrow Base of Support  Ambulates less than 4 steps heel to toe or cannot perform without assistance.    Gait with Eyes Closed  Walks 20 ft, uses assistive device, slower speed, mild gait deviations, deviates 6-10 in outside 12 in walkway width. Ambulates 20 ft in less than 9 sec but greater than 7 sec.    Ambulating Backwards  Walks 20 ft, no assistive devices, good speed, no evidence for imbalance, normal gait  Steps  Alternating feet, no rail.    Total Score  26    FGA comment:  26/30                  [facility] Adult PT Treatment/Exercise - 09/12/17 1032      Exercises   Exercises  Knee/Hip;Ankle       Adductor Stretch, Reclined (Strap, Wall)    Warm up with leg vertical. Rotate leg to side and fix foot to wall. Anchor opposite hip. Hold for _3-4___ breaths.  Bring leg back to middle and then across the body until you feel a stretch in your hip.  Hold 3-4 breaths. Repeat __2__ times each leg.  Copyright  VHI. All rights reserved.  Hamstring Stretch (Sitting)    Sitting, extend one leg and place hands on same thigh for support. Keeping torso straight, lean forward, sliding hands down leg, until a stretch is felt in back of thigh. Hold ____ seconds. Repeat with other leg.  Side Waist Stretch from Child's Pose    From child's pose, walk hands to left. Reach right hand out on diagonal. Reach hips back toward heels making  a C with torso. Breathe into right side waist. Hold for __3-4__ breaths.  Walk hands to other side; try not to rotate to right. Hold 3-4 breaths. Repeat __2-3__ times each side.       PT Education - 09/12/17 1220    Education provided  Yes    Education Details  FGA findings, initial stretching HEP, dry needling    Person(s) Educated  Patient    Methods  Explanation;Demonstration;Handout    Comprehension  Verbalized understanding;Returned demonstration       PT Short Term Goals - 09/12/17 1227      PT SHORT TERM GOAL #1   Title  Pt will participate in further balance and gait assessment with FGA     Time  6    Period  Weeks    Status  Achieved      PT SHORT TERM GOAL #2   Title  Pt will report 25% less pain in bilat shoulders and neck     Baseline  constant, daily     Time  6    Period  Weeks    Status  New    Target Date  10/01/17      PT SHORT TERM GOAL #3   Title  Pt will demonstrate improved posture with more symmetrical trunk and shoulders and decreased L scapular winging    Time  6    Period  Weeks    Status  New    Target Date  10/01/17      PT SHORT TERM GOAL #4   Title  Pt will demonstrate 10 deg increase in L ankle DF ROM and L hamstring flexion ROM    Baseline  65 deg hamstring flexion, 0 deg ankle DF    Time  6    Period  Weeks    Status  New    Target Date  10/01/17        PT Long Term Goals - 09/12/17 1227      PT LONG TERM GOAL #1   Title  Pt will demonstrate independence with HEP and compliance with community wellness plan    Time  12    Period  Weeks    Status  New    Target Date  11/15/17      PT LONG TERM GOAL #2  Title  Pt will demonstrate improved balance during gait with 2-3 point increase in FGA     Baseline  26/30    Time  12    Period  Weeks    Status  New    Target Date  11/15/17      PT LONG TERM GOAL #3   Title  Pt will report 50% decrease in daily pain in shoulders and neck     Time  12    Period  Weeks    Status   New    Target Date  11/15/17      PT LONG TERM GOAL #4   Title  Pt will demonstrate improved posture in sitting/standing with decreased forward head, rounded shoulders, L scapular winging and L elevated shoulder with more symmetrical trunk/rib positions    Time  12    Period  Weeks    Status  New    Target Date  11/15/17      PT LONG TERM GOAL #5   Title  Pt will ascend and descend stairs with alternating sequence, no rail with decreased circumduction when ascending and decreased L trunk lean when descending    Time  12    Period  Weeks    Status  New    Target Date  11/15/17            Plan - 09/12/17 1223    Clinical Impression Statement  Treatment session today focused on continued assessment of balance during higher level gait; pt demonstrates greatest difficulty and impaired balance with narrow BOS and with decreased use of vision.  Initiated stretching HEP for hamstrings, proximal hip mm and trunk on mat.  Plan to address neck tightness at next session with dry needling.  Will continue to progress towards LTG.    Rehab Potential  Good    PT Frequency  Other (comment) 1-2x/week based on work schedule    PT Duration  12 weeks    PT Treatment/Interventions  ADLs/Self Care Home Management;Aquatic Therapy;Electrical Stimulation;Moist Heat;Cryotherapy;Gait training;Stair training;Functional mobility training;Therapeutic activities;Therapeutic exercise;Balance training;Neuromuscular re-education;Patient/family education;Manual techniques;Passive range of motion;Dry needling;Taping    PT Next Visit Plan  Dry needling L upper trap and neck stretches.  Core strength and balance training, narrow BOS/tandem (tall kneeling, quadruped, stability ball, pilates); Bioness if appropriate LLE - DF, hamstring    PT Home Exercise Plan  pt enjoys swimming; pilates?    Consulted and Agree with Plan of Care  Patient       Patient will benefit from skilled therapeutic intervention in order to  improve the following deficits and impairments:  Abnormal gait, Decreased range of motion, Impaired tone, Impaired UE functional use, Postural dysfunction, Pain  Visit Diagnosis: Cerebral palsy, unspecified type (HCC)  Other symptoms and signs involving the nervous system  Other abnormalities of gait and mobility  Chronic left shoulder pain  Cervicalgia  Abnormal posture     Problem List Patient Active Problem List   Diagnosis Date Noted  . SVT (supraventricular tachycardia) (HCC)     Dierdre Highman, PT, DPT 09/12/17    12:29 PM    [facility] [address] [place], [place], [address] Phone: [phone]   Fax:  [phone]  Name: Debra King MRN: [id number] Date of Birth: 06-10-1981

## 2017-09-19 ENCOUNTER — Encounter: Payer: Self-pay | Admitting: Physical Therapy

## 2017-09-19 ENCOUNTER — Ambulatory Visit: Payer: BLUE CROSS/BLUE SHIELD | Admitting: Physical Therapy

## 2017-09-19 DIAGNOSIS — R2689 Other abnormalities of gait and mobility: Secondary | ICD-10-CM | POA: Diagnosis not present

## 2017-09-19 DIAGNOSIS — M25512 Pain in left shoulder: Secondary | ICD-10-CM | POA: Diagnosis not present

## 2017-09-19 DIAGNOSIS — R29818 Other symptoms and signs involving the nervous system: Secondary | ICD-10-CM | POA: Diagnosis not present

## 2017-09-19 DIAGNOSIS — M542 Cervicalgia: Secondary | ICD-10-CM | POA: Diagnosis not present

## 2017-09-19 DIAGNOSIS — R293 Abnormal posture: Secondary | ICD-10-CM

## 2017-09-19 DIAGNOSIS — G809 Cerebral palsy, unspecified: Secondary | ICD-10-CM | POA: Diagnosis not present

## 2017-09-19 DIAGNOSIS — G8929 Other chronic pain: Secondary | ICD-10-CM

## 2017-09-19 NOTE — Therapy (Signed)
[facility] [address] [place], [place], [address] Phone: [phone]   Fax:  [phone]  Physical Therapy Treatment  Patient Details  Name: Debra LundKrista King MRN: [id number] Date of Birth: [birth year] Referring Provider: Sherian ReinJody Bovard Stuckert, MD   Encounter Date: 09/19/2017  PT End of Session - 09/19/17 1200    Visit Number  3    Number of Visits  13    Date for PT Re-Evaluation  11/15/17    Authorization Type  BCBS    PT Start Time  1101    PT Stop Time  1139    PT Time Calculation (min)  38 min    Activity Tolerance  Patient tolerated treatment well    Behavior During Therapy  [facility] for tasks assessed/performed       Past Medical History:  Diagnosis Date  . Cerebral palsy (HCC)    Involving left leg and arm  . SVT (supraventricular tachycardia) (HCC)    Started in her 8720s    History reviewed. No pertinent surgical history.  There were no vitals filed for this visit.  Subjective Assessment - 09/19/17 1102    Subjective  doing well.  wants to try some DN.    Pertinent History  CP with L hemiplegia, SVT.  No sugeries as a child or adult.      Patient Stated Goals  Finding ways to work with her body and get out of bad habits.    Currently in Pain?  No/denies                      [facility] - 09/19/17 1156      Exercises   Exercises  Neck      Manual Therapy   Manual Therapy  Soft tissue mobilization;Myofascial release    Manual therapy comments  pt supine    Soft tissue mobilization  Lt upper trap and levator scapula; Rt levator scapula; instructed in use of tennis ball for STM    Myofascial Release  sub occipital release; and TPR to Lt upper trap and Rt levator scapula      Neck Exercises: Stretches   Upper Trapezius Stretch  Right;Left;2 reps;30 seconds    Upper Trapezius Stretch Limitations  assisted in problem solving best ways for pt to stretch UT        Trigger Point Dry Needling - 09/19/17 1105    Consent Given?  Yes    Education Handout Provided  Yes    Muscles Treated Upper Body  Upper trapezius Lt    Upper Trapezius Response  Twitch reponse elicited;Palpable increased muscle length           PT Education - 09/19/17 1200    Education provided  Yes    Education Details  DN, upper trap stretch    Person(s) Educated  Patient    Methods  Explanation;Demonstration;Handout    Comprehension  Verbalized understanding;Returned demonstration       PT Short Term Goals - 09/12/17 1227      PT SHORT TERM GOAL #1   Title  Pt will participate in further balance and gait assessment with FGA     Time  6    Period  Weeks    Status  Achieved      PT SHORT TERM GOAL #2   Title  Pt will report 25% less pain in bilat shoulders and neck     Baseline  constant, daily  Time  6    Period  Weeks    Status  New    Target Date  10/01/17      PT SHORT TERM GOAL #3   Title  Pt will demonstrate improved posture with more symmetrical trunk and shoulders and decreased L scapular winging    Time  6    Period  Weeks    Status  New    Target Date  10/01/17      PT SHORT TERM GOAL #4   Title  Pt will demonstrate 10 deg increase in L ankle DF ROM and L hamstring flexion ROM    Baseline  65 deg hamstring flexion, 0 deg ankle DF    Time  6    Period  Weeks    Status  New    Target Date  10/01/17        PT Long Term Goals - 09/12/17 1227      PT LONG TERM GOAL #1   Title  Pt will demonstrate independence with HEP and compliance with community wellness plan    Time  12    Period  Weeks    Status  New    Target Date  11/15/17      PT LONG TERM GOAL #2   Title  Pt will demonstrate improved balance during gait with 2-3 point increase in FGA     Baseline  26/30    Time  12    Period  Weeks    Status  New    Target Date  11/15/17      PT LONG TERM GOAL #3   Title  Pt will report 50% decrease in daily pain in shoulders and neck      Time  12    Period  Weeks    Status  New    Target Date  11/15/17      PT LONG TERM GOAL #4   Title  Pt will demonstrate improved posture in sitting/standing with decreased forward head, rounded shoulders, L scapular winging and L elevated shoulder with more symmetrical trunk/rib positions    Time  12    Period  Weeks    Status  New    Target Date  11/15/17      PT LONG TERM GOAL #5   Title  Pt will ascend and descend stairs with alternating sequence, no rail with decreased circumduction when ascending and decreased L trunk lean when descending    Time  12    Period  Weeks    Status  New    Target Date  11/15/17            Plan - 09/19/17 1201    Clinical Impression Statement  Pt tolerated DN well today with good twitch responses and decreased muscle tightness noted following in Lt upper trap.  Rt levator scapula with large active trigger point that may also benefit from DN.  Added UT stretch to HEP and helped to modify best for pt.  Progressing well with PT.     PT Treatment/Interventions  ADLs/Self Care Home Management;Aquatic Therapy;Electrical Stimulation;Moist Heat;Cryotherapy;Gait training;Stair training;Functional mobility training;Therapeutic activities;Therapeutic exercise;Balance training;Neuromuscular re-education;Patient/family education;Manual techniques;Passive range of motion;Dry needling;Taping    PT Next Visit Plan  Assess response to DN; review neck stretch; consider DN to levators.  Core strength and balance training, narrow BOS/tandem (tall kneeling, quadruped, stability ball, pilates); Bioness if appropriate LLE - DF, hamstring    Consulted and Agree with Plan of Care  Patient       Patient will benefit from skilled therapeutic intervention in order to improve the following deficits and impairments:  Abnormal gait, Decreased range of motion, Impaired tone, Impaired UE functional use, Postural dysfunction, Pain  Visit Diagnosis: Cerebral palsy, unspecified  type (HCC)  Other abnormalities of gait and mobility  Other symptoms and signs involving the nervous system  Chronic left shoulder pain  Cervicalgia  Abnormal posture     Problem List Patient Active Problem List   Diagnosis Date Noted  . SVT (supraventricular tachycardia) (HCC)       Clarita Crane, PT, DPT 09/19/17 12:05 PM    [facility] [address] [place], [place], [address] Phone: [phone]   Fax:  [phone]  Name: Rhemi Balbach MRN: [id number] Date of Birth: 1980/12/27

## 2017-09-19 NOTE — Patient Instructions (Signed)
   Trigger Point Dry Needling  . What is Trigger Point Dry Needling (DN)? o DN is a physical therapy technique used to treat muscle pain and dysfunction. Specifically, DN helps deactivate muscle trigger points (muscle knots).  o A thin filiform needle is used to penetrate the skin and stimulate the underlying trigger point. The goal is for a local twitch response (LTR) to occur and for the trigger point to relax. No medication of any kind is injected during the procedure.   . What Does Trigger Point Dry Needling Feel Like?  o The procedure feels different for each individual patient. Some patients report that they do not actually feel the needle enter the skin and overall the process is not painful. Very mild bleeding may occur. However, many patients feel a deep cramping in the muscle in which the needle was inserted. This is the local twitch response.   Marland Kitchen. How Will I feel after the treatment? o Soreness is normal, and the onset of soreness may not occur for a few hours. Typically this soreness does not last longer than two days.  o Bruising is uncommon, however; ice can be used to decrease any possible bruising.  o In rare cases feeling tired or nauseous after the treatment is normal. In addition, your symptoms may get worse before they get better, this period will typically not last longer than 24 hours.   . What Can I do After My Treatment? o Increase your hydration by drinking more water for the next 24 hours. o You may place ice or heat on the areas treated that have become sore, however, do not use heat on inflamed or bruised areas. Heat often brings more relief post needling. o You can continue your regular activities, but vigorous activity is not recommended initially after the treatment for 24 hours. o DN is best combined with other physical therapy such as strengthening, stretching, and other therapies.    Flexibility: Upper Trapezius Stretch    Gently grasp right side of head  while reaching behind back with other hand. Tilt head away until a gentle stretch is felt. Hold __30__ seconds. Repeat __2-3__ times per set. Do __1__ sets per session. Do __2-3__ sessions per day.

## 2017-09-26 ENCOUNTER — Ambulatory Visit: Payer: BLUE CROSS/BLUE SHIELD | Admitting: Physical Therapy

## 2017-09-26 DIAGNOSIS — R293 Abnormal posture: Secondary | ICD-10-CM | POA: Diagnosis not present

## 2017-09-26 DIAGNOSIS — G809 Cerebral palsy, unspecified: Secondary | ICD-10-CM | POA: Diagnosis not present

## 2017-09-26 DIAGNOSIS — M542 Cervicalgia: Secondary | ICD-10-CM | POA: Diagnosis not present

## 2017-09-26 DIAGNOSIS — M25512 Pain in left shoulder: Secondary | ICD-10-CM

## 2017-09-26 DIAGNOSIS — G8929 Other chronic pain: Secondary | ICD-10-CM

## 2017-09-26 DIAGNOSIS — R29818 Other symptoms and signs involving the nervous system: Secondary | ICD-10-CM

## 2017-09-26 DIAGNOSIS — R2689 Other abnormalities of gait and mobility: Secondary | ICD-10-CM

## 2017-09-26 NOTE — Patient Instructions (Signed)
Abdominal Breathing Lying Down    Place one hand on stomach. Breathe in slowly through nose, letting stomach rise. Breathe out gently through pursed lips, letting stomach fall. Breathe out for at least twice as long as you breathe in. Repeat _10___ times, __2__ times daily.  Bracing With Knee Fallout (Hook-Lying)    Breathe IN.  While breathing OUT- tighten pelvic floor and abdominals drop one knee out to side (20-30 deg). Keep opposite hip still.  Return to upright and repeat with other leg.   Repeat _10__ times each leg. Do _2__ times a day.  Bracing With Leg March (Hook-Lying)    Breathe IN.  While breathing OUT- tighten pelvic floor and abdominals and hold. Lift one foot _6-10__ inches and return to floor.  Repeat sequence with other leg lifting. Repeat _10__ times each leg. Do _2__ times a day.   EXTENSION: Supine - Elbow Flexed (Isometric)    Lie on back, knees bent both arm at side on surface, elbow bent. Breathing in, Press shoulder and upper arm down into surface, keeping elbow bent. Hold _2-3__ seconds. When breathing out relax shoulders and pull abdomen in. Repeat 12 times.  Copyright  V[employer]. All rights reserved.

## 2017-09-26 NOTE — Therapy (Signed)
[facility] [address] [place], [place], [address] Phone: [phone]   Fax:  [phone]  Physical Therapy Treatment  Patient Details  Name: Debra LundKrista King MRN: [id number] Date of Birth: [birth year] Referring Provider: Sherian ReinJody Bovard Stuckert, MD   Encounter Date: 09/26/2017  PT End of Session - 09/26/17 1113    Visit Number  4    Number of Visits  13    Date for PT Re-Evaluation  11/15/17    Authorization Type  BCBS    PT Start Time  1016    PT Stop Time  1105    PT Time Calculation (min)  49 min    Activity Tolerance  Patient tolerated treatment well    Behavior During Therapy  [facility] for tasks assessed/performed       Past Medical History:  Diagnosis Date  . Cerebral palsy (HCC)    Involving left leg and arm  . SVT (supraventricular tachycardia) (HCC)    Started in her 4420s    No past surgical history on file.  There were no vitals filed for this visit.  Subjective Assessment - 09/26/17 1023    Subjective  A little soreness after DN last session.  Is gaining more awareness with stretches.  L scapula still slightly more elevated than R, R scapula more protracted and upwardly rotated.  Unsure if she is performing side stretches correctly.    Pertinent History  CP with L hemiplegia, SVT.  No sugeries as a child or adult.      Patient Stated Goals  Finding ways to work with her body and get out of bad habits.    Currently in Pain?  No/denies                      [facility] - 09/26/17 1029      Exercises   Exercises  Lumbar      Neck Exercises: Supine   Other Supine Exercise  breathing and TA activation in supine with bilat UE and shoulder extension and scapular retraction x 12 reps with verbal cues for correct motor sequencing and activation      Lumbar Exercises: Supine   Ab Set  10 reps    AB Set Limitations  TA bracing with single leg fall out x 2 minutes, bracing  with alternating marching x 2 minutes,       Knee/Hip Exercises: Stretches   Other Knee/Hip Stretches  Prone childs pose with reaching to L for R trunk and hip stretch.  Side sitting on L side, one UE support on mat reaching for L trunk and hip stretch with decreased hip flexion compression on R side        Abdominal Breathing Lying Down    Place one hand on stomach. Breathe in slowly through nose, letting stomach rise. Breathe out gently through pursed lips, letting stomach fall. Breathe out for at least twice as long as you breathe in. Repeat _10___ times, __2__ times daily.  Bracing With Knee Fallout (Hook-Lying)    Breathe IN.  While breathing OUT- tighten pelvic floor and abdominals drop one knee out to side (20-30 deg). Keep opposite hip still.  Return to upright and repeat with other leg.   Repeat _10__ times each leg. Do _2__ times a day.  Bracing With Leg March (Hook-Lying)    Breathe IN.  While breathing OUT- tighten pelvic floor and abdominals and hold. Lift one foot _6-10__ inches and return  to floor.  Repeat sequence with other leg lifting. Repeat _10__ times each leg. Do _2__ times a day.   EXTENSION: Supine - Elbow Flexed (Isometric)    Lie on back, knees bent both arm at side on surface, elbow bent. Breathing in, Press shoulder and upper arm down into surface, keeping elbow bent. Hold _2-3__ seconds. When breathing out relax shoulders and pull abdomen in. Repeat 12 times.      PT Education - 09/26/17 1112    Education provided  Yes    Education Details  adjusted trunk stretching; added supine core/TA bracing exercises    Person(s) Educated  Patient    Methods  Explanation;Handout    Comprehension  Verbalized understanding;Returned demonstration;Need further instruction       PT Short Term Goals - 09/12/17 1227      PT SHORT TERM GOAL #1   Title  Pt will participate in further balance and gait assessment with FGA     Time  6    Period  Weeks     Status  Achieved      PT SHORT TERM GOAL #2   Title  Pt will report 25% less pain in bilat shoulders and neck     Baseline  constant, daily     Time  6    Period  Weeks    Status  New    Target Date  10/01/17      PT SHORT TERM GOAL #3   Title  Pt will demonstrate improved posture with more symmetrical trunk and shoulders and decreased L scapular winging    Time  6    Period  Weeks    Status  New    Target Date  10/01/17      PT SHORT TERM GOAL #4   Title  Pt will demonstrate 10 deg increase in L ankle DF ROM and L hamstring flexion ROM    Baseline  65 deg hamstring flexion, 0 deg ankle DF    Time  6    Period  Weeks    Status  New    Target Date  10/01/17        PT Long Term Goals - 09/12/17 1227      PT LONG TERM GOAL #1   Title  Pt will demonstrate independence with HEP and compliance with community wellness plan    Time  12    Period  Weeks    Status  New    Target Date  11/15/17      PT LONG TERM GOAL #2   Title  Pt will demonstrate improved balance during gait with 2-3 point increase in FGA     Baseline  26/30    Time  12    Period  Weeks    Status  New    Target Date  11/15/17      PT LONG TERM GOAL #3   Title  Pt will report 50% decrease in daily pain in shoulders and neck     Time  12    Period  Weeks    Status  New    Target Date  11/15/17      PT LONG TERM GOAL #4   Title  Pt will demonstrate improved posture in sitting/standing with decreased forward head, rounded shoulders, L scapular winging and L elevated shoulder with more symmetrical trunk/rib positions    Time  12    Period  Weeks    Status  New  Target Date  11/15/17      PT LONG TERM GOAL #5   Title  Pt will ascend and descend stairs with alternating sequence, no rail with decreased circumduction when ascending and decreased L trunk lean when descending    Time  12    Period  Weeks    Status  New    Target Date  11/15/17            Plan - 09/26/17 1113    Clinical  Impression Statement  Treatment session focused on adjustment of trunk and hip stretches due to R hip and low back pain -changed to side sitting on L side - and initiation of supine core and TA activation training, timing and sequencing with breathing and then with dynamic LE and UE postural exercises.  Pt tends to activate through low back and upper trapezius muscles and required frequent verbal and tactile cues for feedback of correct muscle activation and motor planning.  Will continue to address to progress towards goals.  One session added this week for DN.      PT Treatment/Interventions  ADLs/Self Care Home Management;Aquatic Therapy;Electrical Stimulation;Moist Heat;Cryotherapy;Gait training;Stair training;Functional mobility training;Therapeutic activities;Therapeutic exercise;Balance training;Neuromuscular re-education;Patient/family education;Manual techniques;Passive range of motion;Dry needling;Taping    PT Next Visit Plan  DN to R levator, may need more to L upper trap-scapula still slightly elevated.  Postural exercises; STG due next Monday.  Core strength and balance training, narrow BOS/tandem (tall kneeling, quadruped, stability ball, pilates); Bioness if appropriate LLE - DF, hamstring    Consulted and Agree with Plan of Care  Patient       Patient will benefit from skilled therapeutic intervention in order to improve the following deficits and impairments:  Abnormal gait, Decreased range of motion, Impaired tone, Impaired UE functional use, Postural dysfunction, Pain  Visit Diagnosis: Cerebral palsy, unspecified type (HCC)  Other abnormalities of gait and mobility  Other symptoms and signs involving the nervous system  Chronic left shoulder pain  Cervicalgia  Abnormal posture     Problem List Patient Active Problem List   Diagnosis Date Noted  . SVT (supraventricular tachycardia) (HCC)     Dierdre Highman, PT, DPT 09/26/17    11:19 AM     [facility] [address] [place], [place], [address] Phone: [phone]   Fax:  [phone]  Name: Debra King MRN: [id number] Date of Birth: June 12, 1981

## 2017-09-28 ENCOUNTER — Encounter: Payer: Self-pay | Admitting: Physical Therapy

## 2017-09-28 ENCOUNTER — Ambulatory Visit: Payer: BLUE CROSS/BLUE SHIELD | Admitting: Physical Therapy

## 2017-09-28 DIAGNOSIS — G809 Cerebral palsy, unspecified: Secondary | ICD-10-CM | POA: Diagnosis not present

## 2017-09-28 DIAGNOSIS — R2689 Other abnormalities of gait and mobility: Secondary | ICD-10-CM | POA: Diagnosis not present

## 2017-09-28 DIAGNOSIS — M542 Cervicalgia: Secondary | ICD-10-CM | POA: Diagnosis not present

## 2017-09-28 DIAGNOSIS — R293 Abnormal posture: Secondary | ICD-10-CM

## 2017-09-28 DIAGNOSIS — R29818 Other symptoms and signs involving the nervous system: Secondary | ICD-10-CM

## 2017-09-28 DIAGNOSIS — M25512 Pain in left shoulder: Secondary | ICD-10-CM

## 2017-09-28 DIAGNOSIS — G8929 Other chronic pain: Secondary | ICD-10-CM | POA: Diagnosis not present

## 2017-09-28 NOTE — Therapy (Signed)
[facility] [address] [place], [place], [address] Phone: [phone]   Fax:  [phone]  Physical Therapy Treatment  Patient Details  Name: Debra LundKrista Labarge MRN: [id number] Date of Birth: [birth year] Referring Provider: Sherian ReinJody Bovard Stuckert, MD   Encounter Date: 09/28/2017  PT End of Session - 09/28/17 1011    Visit Number  5    Number of Visits  13    Date for PT Re-Evaluation  11/15/17    Authorization Type  BCBS    PT Start Time  0932    PT Stop Time  1011    PT Time Calculation (min)  39 min    Activity Tolerance  Patient tolerated treatment well    Behavior During Therapy  [facility] assessed/performed       Past Medical History:  Diagnosis Date  . Cerebral palsy (HCC)    Involving left leg and arm  . SVT (supraventricular tachycardia) (HCC)    Started in her 3220s    History reviewed. No pertinent surgical history.  There were no vitals filed for this visit.  Subjective Assessment - 09/28/17 0934    Subjective  did a lot of core work with Bufford LopeAudra on Monday; c/o usual dull pain.  sore after DN, but feels like it was beneficial.    Patient Stated Goals  Finding ways to work with her body and get out of bad habits.    Currently in Pain?  No/denies                      [facility]/Exercise - 09/28/17 1010      Manual Therapy   Manual Therapy  Soft tissue mobilization;Myofascial release    Manual therapy comments  pt prone    Soft tissue mobilization  Bil upper trap, levator scapula and rhomboids    Myofascial Release  bil UT, LS and rhomboids      Neck Exercises: Stretches   Upper Trapezius Stretch  Right;Left;2 reps;30 seconds    Upper Trapezius Stretch Limitations  assisted in problem solving best ways for pt to stretch UT    Levator Stretch  Right;Left;2 reps;30 seconds assisted in how to modify for pt       Trigger Point Dry Needling - 09/28/17 1010    Consent  Given?  Yes    Muscles Treated Upper Body  Upper trapezius;Levator scapulae;Rhomboids    Upper Trapezius Response  Twitch reponse elicited;Palpable increased muscle length    Levator Scapulae Response  Twitch response elicited;Palpable increased muscle length    Rhomboids Response  Twitch response elicited;Palpable increased muscle length           PT Education - 09/28/17 1011    Education provided  Yes    Education Details  levator stretch    Person(s) Educated  Patient    Methods  Explanation;Demonstration;Handout    Comprehension  Verbalized understanding;Returned demonstration       PT Short Term Goals - 09/12/17 1227      PT SHORT TERM GOAL #1   Title  Pt will participate in further balance and gait assessment with FGA     Time  6    Period  Weeks    Status  Achieved      PT SHORT TERM GOAL #2   Title  Pt will report 25% less pain in bilat shoulders and neck     Baseline  constant, daily     Time  6    Period  Weeks    Status  New    Target Date  10/01/17      PT SHORT TERM GOAL #3   Title  Pt will demonstrate improved posture with more symmetrical trunk and shoulders and decreased L scapular winging    Time  6    Period  Weeks    Status  New    Target Date  10/01/17      PT SHORT TERM GOAL #4   Title  Pt will demonstrate 10 deg increase in L ankle DF ROM and L hamstring flexion ROM    Baseline  65 deg hamstring flexion, 0 deg ankle DF    Time  6    Period  Weeks    Status  New    Target Date  10/01/17        PT Long Term Goals - 09/12/17 1227      PT LONG TERM GOAL #1   Title  Pt will demonstrate independence with HEP and compliance with community wellness plan    Time  12    Period  Weeks    Status  New    Target Date  11/15/17      PT LONG TERM GOAL #2   Title  Pt will demonstrate improved balance during gait with 2-3 point increase in FGA     Baseline  26/30    Time  12    Period  Weeks    Status  New    Target Date  11/15/17      PT  LONG TERM GOAL #3   Title  Pt will report 50% decrease in daily pain in shoulders and neck     Time  12    Period  Weeks    Status  New    Target Date  11/15/17      PT LONG TERM GOAL #4   Title  Pt will demonstrate improved posture in sitting/standing with decreased forward head, rounded shoulders, L scapular winging and L elevated shoulder with more symmetrical trunk/rib positions    Time  12    Period  Weeks    Status  New    Target Date  11/15/17      PT LONG TERM GOAL #5   Title  Pt will ascend and descend stairs with alternating sequence, no rail with decreased circumduction when ascending and decreased L trunk lean when descending    Time  12    Period  Weeks    Status  New    Target Date  11/15/17            Plan - 09/28/17 1214    Clinical Impression Statement  Pt tolerated DN well today and noted decreased trigger points in LT upper trap today with mild twitch responses but much improved from last session.  DN performed again to Rt levator and Lt rhomboids with good twitch responses and decreased trigger points following.  Progressing towards goals.     PT Treatment/Interventions  ADLs/Self Care Home Management;Aquatic Therapy;Electrical Stimulation;Moist Heat;Cryotherapy;Gait training;Stair training;Functional mobility training;Therapeutic activities;Therapeutic exercise;Balance training;Neuromuscular re-education;Patient/family education;Manual techniques;Passive range of motion;Dry needling;Taping    PT Next Visit Plan  Assess response to DN; Postural exercises; STG due next Monday.  Core strength and balance training, narrow BOS/tandem (tall kneeling, quadruped, stability ball, pilates); Bioness if appropriate LLE - DF, hamstring    Consulted and Agree with Plan of Care  Patient  Patient will benefit from skilled therapeutic intervention in order to improve the following deficits and impairments:  Abnormal gait, Decreased range of motion, Impaired tone, Impaired  UE functional use, Postural dysfunction, Pain  Visit Diagnosis: Cerebral palsy, unspecified type (HCC)  Other abnormalities of gait and mobility  Other symptoms and signs involving the nervous system  Chronic left shoulder pain  Cervicalgia  Abnormal posture     Problem List Patient Active Problem List   Diagnosis Date Noted  . SVT (supraventricular tachycardia) (HCC)       Clarita Crane, PT, DPT 09/28/17 12:16 PM    [facility] [address] [place], [place], [address] Phone: [phone]   Fax:  [phone]  Name: Kentley Cedillo MRN: [id number] Date of Birth: 1980-11-12

## 2017-09-28 NOTE — Patient Instructions (Signed)
Levator Scapula Stretch, Sitting    Sit, one hand tucked under hip on side to be stretched, other hand over top of head. Turn head toward other side and look down. Use hand on head to gently stretch neck in that position. Hold _30__ seconds. Repeat _2-3__ times per session. Do __1-2_ sessions per day.  Copyright  VHI. All rights reserved.

## 2017-10-03 ENCOUNTER — Encounter: Payer: Self-pay | Admitting: Physical Therapy

## 2017-10-03 ENCOUNTER — Ambulatory Visit: Payer: BLUE CROSS/BLUE SHIELD | Admitting: Physical Therapy

## 2017-10-03 DIAGNOSIS — G809 Cerebral palsy, unspecified: Secondary | ICD-10-CM | POA: Diagnosis not present

## 2017-10-03 DIAGNOSIS — R29818 Other symptoms and signs involving the nervous system: Secondary | ICD-10-CM

## 2017-10-03 DIAGNOSIS — M25512 Pain in left shoulder: Secondary | ICD-10-CM

## 2017-10-03 DIAGNOSIS — G8929 Other chronic pain: Secondary | ICD-10-CM

## 2017-10-03 DIAGNOSIS — R293 Abnormal posture: Secondary | ICD-10-CM

## 2017-10-03 DIAGNOSIS — R2689 Other abnormalities of gait and mobility: Secondary | ICD-10-CM

## 2017-10-03 DIAGNOSIS — M542 Cervicalgia: Secondary | ICD-10-CM | POA: Diagnosis not present

## 2017-10-03 NOTE — Patient Instructions (Signed)
When Breathing IN: Start in the belly letting the belly raise, chest/ribs,  then collar bone raises gently   Bracing With Heel Slides (Supine)    Breathe IN.  While breathing OUT- tighten pelvic floor and abdominals and hold. Slide one heel to bottom and back up.  Repeat sequence with other leg. Repeat _10__ times each leg. Do _2__ times a day.

## 2017-10-03 NOTE — Therapy (Signed)
[facility] [address] [place], [place], [address] Phone: [phone]   Fax:  [phone]  Physical Therapy Treatment  Patient Details  Name: Debra King MRN: [id number] Date of Birth: February 14, 1981 Referring Provider: Sherian Rein, MD   Encounter Date: 10/03/2017  PT End of Session - 10/03/17 1111    Visit Number  6    Number of Visits  13    Date for PT Re-Evaluation  11/15/17    Authorization Type  BCBS    PT Start Time  1023    PT Stop Time  1108    PT Time Calculation (min)  45 min    Activity Tolerance  Patient tolerated treatment well    Behavior During Therapy  [facility] for tasks assessed/performed       Past Medical History:  Diagnosis Date  . Cerebral palsy (HCC)    Involving left leg and arm  . SVT (supraventricular tachycardia) (HCC)    Started in her 53s    History reviewed. No pertinent surgical history.  There were no vitals filed for this visit.  Subjective Assessment - 10/03/17 1030    Subjective  No issues from DN; no increase in pain or soreness.  Hasn't noticed if there is a significant increase in ROM.  Still having a hard time motor planning shoulder presses during core work.  New stretches are working well.    Patient Stated Goals  Finding ways to work with her body and get out of bad habits.    Currently in Pain?  No/denies                      [facility] Adult PT Treatment/Exercise - 10/03/17 1032      Lumbar Exercises: Supine   Ab Set  10 reps    AB Set Limitations  TA bracing with single leg fall out x 2 minutes, bracing with alternating marching; added alternating heel slides while maintaining TA activation    Clam  10 reps;Other (comment) alternating LE     Heel Slides  10 reps    Bent Knee Raise  10 reps    Other Supine Lumbar Exercises  Supine core activation during breathing with simultaneous shoulder depression and scapular retraction x 10 reps; therapist  blocking shoulder elevation during inhalation             PT Education - 10/03/17 1110    Education provided  Yes    Education Details  progressed core exercises, breathing sequence and core activation    Person(s) Educated  Patient    Methods  Explanation;Demonstration;Handout    Comprehension  Verbalized understanding;Returned demonstration       PT Short Term Goals - 09/12/17 1227      PT SHORT TERM GOAL #1   Title  Pt will participate in further balance and gait assessment with FGA     Time  6    Period  Weeks    Status  Achieved      PT SHORT TERM GOAL #2   Title  Pt will report 25% less pain in bilat shoulders and neck     Baseline  constant, daily     Time  6    Period  Weeks    Status  New    Target Date  10/01/17      PT SHORT TERM GOAL #3   Title  Pt will demonstrate improved posture with more symmetrical trunk and shoulders  and decreased L scapular winging    Time  6    Period  Weeks    Status  New    Target Date  10/01/17      PT SHORT TERM GOAL #4   Title  Pt will demonstrate 10 deg increase in L ankle DF ROM and L hamstring flexion ROM    Baseline  65 deg hamstring flexion, 0 deg ankle DF    Time  6    Period  Weeks    Status  New    Target Date  10/01/17        PT Long Term Goals - 09/12/17 1227      PT LONG TERM GOAL #1   Title  Pt will demonstrate independence with HEP and compliance with community wellness plan    Time  12    Period  Weeks    Status  New    Target Date  11/15/17      PT LONG TERM GOAL #2   Title  Pt will demonstrate improved balance during gait with 2-3 point increase in FGA     Baseline  26/30    Time  12    Period  Weeks    Status  New    Target Date  11/15/17      PT LONG TERM GOAL #3   Title  Pt will report 50% decrease in daily pain in shoulders and neck     Time  12    Period  Weeks    Status  New    Target Date  11/15/17      PT LONG TERM GOAL #4   Title  Pt will demonstrate improved posture in  sitting/standing with decreased forward head, rounded shoulders, L scapular winging and L elevated shoulder with more symmetrical trunk/rib positions    Time  12    Period  Weeks    Status  New    Target Date  11/15/17      PT LONG TERM GOAL #5   Title  Pt will ascend and descend stairs with alternating sequence, no rail with decreased circumduction when ascending and decreased L trunk lean when descending    Time  12    Period  Weeks    Status  New    Target Date  11/15/17            Plan - 10/03/17 1111    Clinical Impression Statement  Continued and progressed core activation training in supine combined with diaphragmatic breathing sequence.  Pt demonstrating improved coordination of breathing with mm activation but continues to require verbal and tactile cues for increased control of LLE movement and stability of pelvis.  Able to progress to adding alternating heel slides to program.  Pt also demonstrating improved shoulder ROM from DN and improved ability to activate and perform scapular retraction and depression.  Therapist provided blocking of shoulder elevation during inhalation as feedback to promote initiation of inhalation with diaphragm instead of scalenes and upper trapezius.  Will continue to address and progress towards LTG.    PT Treatment/Interventions  ADLs/Self Care Home Management;Aquatic Therapy;Electrical Stimulation;Moist Heat;Cryotherapy;Gait training;Stair training;Functional mobility training;Therapeutic activities;Therapeutic exercise;Balance training;Neuromuscular re-education;Patient/family education;Manual techniques;Passive range of motion;Dry needling;Taping    PT Next Visit Plan  Check STG!  Postural exercises; Core strength and balance training, narrow BOS/tandem (tall kneeling, quadruped, stability ball, pilates); Bioness if appropriate LLE - DF, hamstring    Consulted and Agree with Plan of Care  Patient  Patient will benefit from skilled  therapeutic intervention in order to improve the following deficits and impairments:  Abnormal gait, Decreased range of motion, Impaired tone, Impaired UE functional use, Postural dysfunction, Pain  Visit Diagnosis: Cerebral palsy, unspecified type (HCC)  Other abnormalities of gait and mobility  Other symptoms and signs involving the nervous system  Chronic left shoulder pain  Cervicalgia  Abnormal posture     Problem List Patient Active Problem List   Diagnosis Date Noted  . SVT (supraventricular tachycardia) (HCC)     Dierdre Highman, PT, DPT 10/03/17    11:20 AM    [facility] [address] [place], [place], [address] Phone: [phone]   Fax:  [phone]  Name: Debra King MRN: [id number] Date of Birth: 03/02/1981

## 2017-10-10 ENCOUNTER — Ambulatory Visit: Payer: BLUE CROSS/BLUE SHIELD | Admitting: Physical Therapy

## 2017-10-12 ENCOUNTER — Encounter: Payer: Self-pay | Admitting: Physical Therapy

## 2017-10-12 ENCOUNTER — Ambulatory Visit: Payer: BLUE CROSS/BLUE SHIELD | Attending: Obstetrics and Gynecology | Admitting: Physical Therapy

## 2017-10-12 DIAGNOSIS — R29818 Other symptoms and signs involving the nervous system: Secondary | ICD-10-CM

## 2017-10-12 DIAGNOSIS — G809 Cerebral palsy, unspecified: Secondary | ICD-10-CM | POA: Diagnosis not present

## 2017-10-12 DIAGNOSIS — M25512 Pain in left shoulder: Secondary | ICD-10-CM | POA: Diagnosis not present

## 2017-10-12 DIAGNOSIS — R2689 Other abnormalities of gait and mobility: Secondary | ICD-10-CM | POA: Diagnosis not present

## 2017-10-12 DIAGNOSIS — M542 Cervicalgia: Secondary | ICD-10-CM | POA: Diagnosis not present

## 2017-10-12 DIAGNOSIS — G8929 Other chronic pain: Secondary | ICD-10-CM | POA: Diagnosis not present

## 2017-10-12 DIAGNOSIS — R293 Abnormal posture: Secondary | ICD-10-CM

## 2017-10-12 NOTE — Patient Instructions (Signed)
Bridge    Lie back, legs bent. Inhale, pressing hips up, press back of arms down into floor, shoulders down towards hips. Keeping ribs in, lengthen lower back. Exhale, rolling down along spine from top. Repeat __10__ times. Do __1-2__ sessions per day.  Lower Abdominal (Dead Bug): Strength    Lie on the flat floor, feet resting on a chair (90/90 hips/knees) Breathing out, back flat, lift Right leg off chair, lift left leg off chair maintain abdominal tension.  Lower both slowly breathing in.  Repeat _10__ times. Do _1-2__ sessions per day.

## 2017-10-12 NOTE — Therapy (Signed)
[facility] [address] [place], [place], [address] Phone: [phone]   Fax:  [phone]  Physical Therapy Treatment  Patient Details  Name: Debra King MRN: [id number] Date of Birth: 1981/01/21 Referring Provider: Janyth Contes, MD   Encounter Date: 10/12/2017  PT End of Session - 10/12/17 1111    Visit Number  7    Number of Visits  13    Date for PT Re-Evaluation  11/15/17    Authorization Type  BCBS    PT Start Time  1021    PT Stop Time  1108    PT Time Calculation (min)  47 min    Activity Tolerance  Patient tolerated treatment well    Behavior During Therapy  [facility] for tasks assessed/performed       Past Medical History:  Diagnosis Date  . Cerebral palsy ([place])    Involving left leg and arm  . SVT (supraventricular tachycardia) ([place])    Started in her 56s    History reviewed. No pertinent surgical history.  There were no vitals filed for this visit.  Subjective Assessment - 10/12/17 1023    Subjective  No issues to report.  Feels like the pelvic/core exercises are getting better/easier.    Pertinent History  CP with L hemiplegia, SVT.  No sugeries as a child or adult.      Patient Stated Goals  Finding ways to work with her body and get out of bad habits.    Currently in Pain?  No/denies         [facility] PT Assessment - 10/12/17 1036      Posture/Postural Control   Posture Comments  Today, bilat shoulders more depressed with inferior scapular angles almost level (L still slightly elevated), bilat scapula more retracted, decreased L scapular winging.  Trunk and rib positions more symmetrical.      ROM / Strength   AROM / PROM / Strength  PROM      PROM   Overall PROM   Deficits    Overall PROM Comments  LLE: ankle DF lacking 55 deg to neutral; 78 deg hamstring flexion.  RLE: 72 deg hamstring flexion                  [facility] Adult PT Treatment/Exercise - 10/12/17 1045      Lumbar Exercises: Supine   Ab Set  10 reps    AB Set Limitations  TA bracing with single leg fall out x 5 reps each LE, bracing with alternating marching; alternating heel slides while maintaining TA activation.  Improved sequencing and coordination of diaphragmatic breathing today    Dead Bug  5 reps feet on ball, lifting R/L LE off ball one at a time, w/ hold    Bridge  Non-compliant;5 reps with shoulder depression and extension        Bridge    Lie back, legs bent. Inhale, pressing hips up, press back of arms down into floor, shoulders down towards hips. Keeping ribs in, lengthen lower back. Exhale, rolling down along spine from top. Repeat __10__ times. Do __1-2__ sessions per day.  Lower Abdominal (Dead Bug): Strength    Lie on the flat floor, feet resting on a chair (90/90 hips/knees) Breathing out, back flat, lift Right leg off chair, lift left leg off chair maintain abdominal tension.  Lower both slowly breathing in.  Repeat _10__ times. Do _1-2__ sessions per day.      PT Education - 10/12/17  80    Education provided  Yes    Education Details  progressed core HEP and added back in bridges    Person(s) Educated  Patient    Methods  Explanation;Demonstration;Handout    Comprehension  Verbalized understanding;Returned demonstration       PT Short Term Goals - 10/12/17 1024      PT SHORT TERM GOAL #1   Title  Pt will participate in further balance and gait assessment with FGA     Time  6    Period  Weeks    Status  Achieved      PT SHORT TERM GOAL #2   Title  Pt will report 25% less pain in bilat shoulders and neck     Baseline  No change in shoulder pain; feels less tension and pain when she is away from her desk.  The longer she sits at the desk, the more tension/pain she has    Time  6    Period  Weeks    Status  Not Met      PT SHORT TERM GOAL #3   Title  Pt will demonstrate improved posture with more symmetrical trunk and shoulders and decreased L  scapular winging    Baseline  R ribs more depressed now (less lateral flexion to L, L scapula more depressed, less winging and bilat scapula more retracted)    Time  6    Period  Weeks    Status  Achieved      PT SHORT TERM GOAL #4   Title  Pt will demonstrate 10 deg increase in L ankle DF ROM and L hamstring flexion ROM    Baseline  65 deg hamstring flexion, 0 deg ankle DF;  55 deg of ankle DF, 78 deg L hamstring flexion, 72 deg R hamstring flexion (SLR)    Time  6    Period  Weeks    Status  Partially Met        PT Long Term Goals - 09/12/17 1227      PT LONG TERM GOAL #1   Title  Pt will demonstrate independence with HEP and compliance with community wellness plan    Time  12    Period  Weeks    Status  New    Target Date  11/15/17      PT LONG TERM GOAL #2   Title  Pt will demonstrate improved balance during gait with 2-3 point increase in FGA     Baseline  26/30    Time  12    Period  Weeks    Status  New    Target Date  11/15/17      PT LONG TERM GOAL #3   Title  Pt will report 50% decrease in daily pain in shoulders and neck     Time  12    Period  Weeks    Status  New    Target Date  11/15/17      PT LONG TERM GOAL #4   Title  Pt will demonstrate improved posture in sitting/standing with decreased forward head, rounded shoulders, L scapular winging and L elevated shoulder with more symmetrical trunk/rib positions    Time  12    Period  Weeks    Status  New    Target Date  11/15/17      PT LONG TERM GOAL #5   Title  Pt will ascend and descend stairs with alternating sequence, no rail with  decreased circumduction when ascending and decreased L trunk lean when descending    Time  12    Period  Weeks    Status  New    Target Date  11/15/17            Plan - 10/12/17 1111    Clinical Impression Statement  Treatment session began with checking STG.  Pt does demonstrate overall improved posture with decreased scapular elevation and more symmetrical rib  position.  Despited improvement in shoulder position and core activation pt continues to experience shoulder and neck tension/pain each day due to job requirements/computer work.  Pt demonstrating significantly improved coordination of breathing sequence with core activation and stability during supine exercises.  Due to improvements progressed supine core activation exercises to involve bilat LE at the same time during breathing.  Also added bridges back in with focus on thoracic extension.  Will continue to progress towards LTG.    PT Treatment/Interventions  ADLs/Self Care Home Management;Aquatic Therapy;Electrical Stimulation;Moist Heat;Cryotherapy;Gait training;Stair training;Functional mobility training;Therapeutic activities;Therapeutic exercise;Balance training;Neuromuscular re-education;Patient/family education;Manual techniques;Passive range of motion;Dry needling;Taping    PT Next Visit Plan  Partner here to watch; Core strength and balance training seated on therapy ball; therapy ball roll outs in quadruped, narrow BOS/tandem (tall kneeling, quadruped, stability ball, pilates); Bioness if appropriate LLE - DF, hamstring    Consulted and Agree with Plan of Care  Patient       Patient will benefit from skilled therapeutic intervention in order to improve the following deficits and impairments:  Abnormal gait, Decreased range of motion, Impaired tone, Impaired UE functional use, Postural dysfunction, Pain  Visit Diagnosis: Cerebral palsy, unspecified type (HCC)  Other abnormalities of gait and mobility  Other symptoms and signs involving the nervous system  Chronic left shoulder pain  Cervicalgia  Abnormal posture     Problem List Patient Active Problem List   Diagnosis Date Noted  . SVT (supraventricular tachycardia) ([place])     Rico Junker, PT, DPT 10/12/17    11:18 AM    [facility] [address] [place], [place], [address] Phone: [phone]   Fax:  [phone]  Name: Debra King MRN: [id number] Date of Birth: Mar 13, 1981

## 2017-10-17 ENCOUNTER — Encounter: Payer: Self-pay | Admitting: Physical Therapy

## 2017-10-17 ENCOUNTER — Ambulatory Visit: Payer: BLUE CROSS/BLUE SHIELD | Admitting: Physical Therapy

## 2017-10-17 DIAGNOSIS — R29818 Other symptoms and signs involving the nervous system: Secondary | ICD-10-CM | POA: Diagnosis not present

## 2017-10-17 DIAGNOSIS — G809 Cerebral palsy, unspecified: Secondary | ICD-10-CM | POA: Diagnosis not present

## 2017-10-17 DIAGNOSIS — R2689 Other abnormalities of gait and mobility: Secondary | ICD-10-CM | POA: Diagnosis not present

## 2017-10-17 DIAGNOSIS — M25512 Pain in left shoulder: Secondary | ICD-10-CM

## 2017-10-17 DIAGNOSIS — M542 Cervicalgia: Secondary | ICD-10-CM | POA: Diagnosis not present

## 2017-10-17 DIAGNOSIS — R293 Abnormal posture: Secondary | ICD-10-CM | POA: Diagnosis not present

## 2017-10-17 DIAGNOSIS — G8929 Other chronic pain: Secondary | ICD-10-CM

## 2017-10-17 NOTE — Therapy (Signed)
[facility] [address] [place], [place], [address] Phone: [phone]   Fax:  [phone]  Physical Therapy Treatment  Patient Details  Name: Debra King MRN: [id number] Date of Birth: [birth year] Referring Provider: Janyth Contes, MD   Encounter Date: 10/17/2017  PT End of Session - 10/17/17 1303    Visit Number  8    Number of Visits  13    Date for PT Re-Evaluation  11/15/17    Authorization Type  BCBS    PT Start Time  1016    PT Stop Time  1103    PT Time Calculation (min)  47 min    Activity Tolerance  Patient tolerated treatment well    Behavior During Therapy  [facility] for tasks assessed/performed       Past Medical History:  Diagnosis Date  . Cerebral palsy ([place])    Involving left leg and arm  . SVT (supraventricular tachycardia) ([place])    Started in her 89s    History reviewed. No pertinent surgical history.  There were no vitals filed for this visit.  Subjective Assessment - 10/17/17 1020    Subjective  Thinks that she has improved with exercises added to HEP.    Pertinent History  CP with L hemiplegia, SVT.  No sugeries as a child or adult.      Patient Stated Goals  Finding ways to work with her body and get out of bad habits.    Currently in Pain?  Yes    Pain Score  2     Pain Location  Neck    Pain Orientation  Right    Pain Descriptors / Indicators  Sore    Pain Type  Chronic pain    Pain Onset  More than a month ago    Pain Relieving Factors  massage, stretching                      [facility] Adult PT Treatment/Exercise - 10/17/17 0001  Cues for core activation and diaphramatic breathing with each exercise     Lumbar Exercises: Supine   Dead Bug  10 reps    Bridge  Non-compliant;10 reps shoulders in depression      Lumbar Exercises: Tall knee/ 1/2 kneel   Other  Lumbar Exercises  Tall kneel: deep squat with UEs extended hold large ball: 10x2            1/2 Kneel:  Upper trunk rotations with 3 sec hold x5 with each foot in front.   Other  Lumbar Exercises  modified side plank : knees bent x5 each side.          cues for core activation and diaphramatic breathing             PT Education - 10/17/17 1302    Education provided  Yes    Education Details  Reviewed exercises added last week. Posture in standing and sitting, remembering to "keep your chest up" to aid with alignment.    Person(s) Educated  Patient    Methods  Explanation;Demonstration;Handout    Comprehension  Verbalized understanding       PT Short Term Goals - 10/12/17 1024      PT SHORT TERM GOAL #1   Title  Pt will participate in further balance and gait assessment with FGA     Time  6    Period  Weeks    Status  Achieved  PT SHORT TERM GOAL #2   Title  Pt will report 25% less pain in bilat shoulders and neck     Baseline  No change in shoulder pain; feels less tension and pain when she is away from her desk.  The longer she sits at the desk, the more tension/pain she has    Time  6    Period  Weeks    Status  Not Met      PT SHORT TERM GOAL #3   Title  Pt will demonstrate improved posture with more symmetrical trunk and shoulders and decreased L scapular winging    Baseline  R ribs more depressed now (less lateral flexion to L, L scapula more depressed, less winging and bilat scapula more retracted)    Time  6    Period  Weeks    Status  Achieved      PT SHORT TERM GOAL #4   Title  Pt will demonstrate 10 deg increase in L ankle DF ROM and L hamstring flexion ROM    Baseline  65 deg hamstring flexion, 0 deg ankle DF;  55 deg of ankle DF, 78 deg L hamstring flexion, 72 deg R hamstring flexion (SLR)    Time  6    Period  Weeks    Status  Partially Met        PT Long Term Goals - 09/12/17 1227      PT LONG TERM GOAL #1   Title  Pt will demonstrate independence with HEP and compliance with community wellness plan    Time  12    Period  Weeks    Status   New    Target Date  11/15/17      PT LONG TERM GOAL #2   Title  Pt will demonstrate improved balance during gait with 2-3 point increase in FGA     Baseline  26/30    Time  12    Period  Weeks    Status  New    Target Date  11/15/17      PT LONG TERM GOAL #3   Title  Pt will report 50% decrease in daily pain in shoulders and neck     Time  12    Period  Weeks    Status  New    Target Date  11/15/17      PT LONG TERM GOAL #4   Title  Pt will demonstrate improved posture in sitting/standing with decreased forward head, rounded shoulders, L scapular winging and L elevated shoulder with more symmetrical trunk/rib positions    Time  12    Period  Weeks    Status  New    Target Date  11/15/17      PT LONG TERM GOAL #5   Title  Pt will ascend and descend stairs with alternating sequence, no rail with decreased circumduction when ascending and decreased L trunk lean when descending    Time  12    Period  Weeks    Status  New    Target Date  11/15/17            Plan - 10/17/17 1303    Clinical Impression Statement  Reviewed and performed exercises added to HEP last visit; pt performed with min cues. Worked on balance, posture, and core strengthening (TA activitation and diaphramatic breathing) in tall kneel and 1/2 kneel positions; pt required  minA in 1/2 kneel position.  Attempted core strengthening and balance  in side plank position unable to perform in left side.                                               PT Treatment/Interventions  ADLs/Self Care Home Management;Aquatic Therapy;Electrical Stimulation;Moist Heat;Cryotherapy;Gait training;Stair training;Functional mobility training;Therapeutic activities;Therapeutic exercise;Balance training;Neuromuscular re-education;Patient/family education;Manual techniques;Passive range of motion;Dry needling;Taping    PT Next Visit Plan  Partner here to watch; Core strength and balance training seated on therapy ball; therapy ball roll  outs in quadruped, narrow BOS/tandem (tall kneeling, quadruped, stability ball, pilates); Bioness if appropriate LLE - DF, hamstring    Consulted and Agree with Plan of Care  Patient       Patient will benefit from skilled therapeutic intervention in order to improve the following deficits and impairments:  Abnormal gait, Decreased range of motion, Impaired tone, Impaired UE functional use, Postural dysfunction, Pain  Visit Diagnosis: Cerebral palsy, unspecified type ([place])  Other symptoms and signs involving the nervous system  Chronic left shoulder pain  Abnormal posture     Problem List Patient Active Problem List   Diagnosis Date Noted  . SVT (supraventricular tachycardia) ([place])    Bjorn Loser, PTA  10/17/17, 1:13 PM [facility] [address] [place], [place], [address] Phone: [phone]   Fax:  [phone]  Name: Debra King MRN: [id number] Date of Birth: June 05, 1981

## 2017-10-17 NOTE — Patient Instructions (Signed)
Bridge    Lie back, legs bent. Inhale, pressing hips up, press back of arms down into floor, shoulders down towards hips. Keeping ribs in, lengthen lower back. Exhale, rolling down along spine from top. Repeat __10__ times. Do __1-2__ sessions per day.  Lower Abdominal (Dead Bug): Strength    Lie on the flat floor, feet resting on a chair (90/90 hips/knees) Breathing out, back flat, lift Right leg off chair, lift left leg off chair maintain abdominal tension.  Lower both slowly breathing in.  Repeat _10__ times. Do _1-2__ sessions per day.   

## 2017-10-24 ENCOUNTER — Ambulatory Visit: Payer: BLUE CROSS/BLUE SHIELD | Admitting: Physical Therapy

## 2017-10-31 ENCOUNTER — Ambulatory Visit: Payer: BLUE CROSS/BLUE SHIELD | Admitting: Physical Therapy

## 2017-11-03 ENCOUNTER — Ambulatory Visit: Payer: BLUE CROSS/BLUE SHIELD | Admitting: Physical Therapy

## 2017-11-10 ENCOUNTER — Ambulatory Visit: Payer: BLUE CROSS/BLUE SHIELD | Attending: Obstetrics and Gynecology | Admitting: Physical Therapy

## 2017-11-10 DIAGNOSIS — R29818 Other symptoms and signs involving the nervous system: Secondary | ICD-10-CM | POA: Diagnosis not present

## 2017-11-10 DIAGNOSIS — M542 Cervicalgia: Secondary | ICD-10-CM | POA: Diagnosis not present

## 2017-11-10 DIAGNOSIS — M25512 Pain in left shoulder: Secondary | ICD-10-CM | POA: Insufficient documentation

## 2017-11-10 DIAGNOSIS — R2689 Other abnormalities of gait and mobility: Secondary | ICD-10-CM | POA: Insufficient documentation

## 2017-11-10 DIAGNOSIS — G8929 Other chronic pain: Secondary | ICD-10-CM

## 2017-11-10 DIAGNOSIS — G809 Cerebral palsy, unspecified: Secondary | ICD-10-CM | POA: Diagnosis not present

## 2017-11-10 DIAGNOSIS — R293 Abnormal posture: Secondary | ICD-10-CM | POA: Diagnosis not present

## 2017-11-10 NOTE — Therapy (Signed)
[facility] [address] [place], [place], [address] Phone: [phone]   Fax:  [phone]  Physical Therapy Treatment and D/C Summary  Patient Details  Name: Debra King MRN: [id number] Date of Birth: [birth year] Referring Provider: Janyth Contes, MD   Encounter Date: 11/10/2017  PT End of Session - 11/10/17 1127    Visit Number  9    Number of Visits  13    Authorization Type  BCBS    PT Start Time  7741    PT Stop Time  0935    PT Time Calculation (min)  48 min    Activity Tolerance  Patient tolerated treatment well    Behavior During Therapy  [facility] for tasks assessed/performed       Past Medical History:  Diagnosis Date  . Cerebral palsy ([place])    Involving left leg and arm  . SVT (supraventricular tachycardia) ([place])    Started in her 64s    No past surgical history on file.  There were no vitals filed for this visit.      [facility] PT Assessment - 11/10/17 0813      Ambulation/Gait   Stairs  Yes    Stairs Assistance  7: Independent    Stair Management Technique  No rails;Alternating pattern;Forwards    Number of Stairs  8    Height of Stairs  6      Functional Gait  Assessment   Gait assessed   Yes    Gait Level Surface  Walks 20 ft in less than 5.5 sec, no assistive devices, good speed, no evidence for imbalance, normal gait pattern, deviates no more than 6 in outside of the 12 in walkway width.    Change in Gait Speed  Able to smoothly change walking speed without loss of balance or gait deviation. Deviate no more than 6 in outside of the 12 in walkway width.    Gait with Horizontal Head Turns  Performs head turns smoothly with no change in gait. Deviates no more than 6 in outside 12 in walkway width    Gait with Vertical Head Turns  Performs head turns with no change in gait. Deviates no more than 6 in outside 12 in walkway width.    Gait and Pivot Turn  Pivot turns safely within 3 sec and  stops quickly with no loss of balance.    Step Over Obstacle  Is able to step over 2 stacked shoe boxes taped together (9 in total height) without changing gait speed. No evidence of imbalance.    Gait with Narrow Base of Support  Ambulates 7-9 steps.    Gait with Eyes Closed  Walks 20 ft, uses assistive device, slower speed, mild gait deviations, deviates 6-10 in outside 12 in walkway width. Ambulates 20 ft in less than 9 sec but greater than 7 sec.    Ambulating Backwards  Walks 20 ft, no assistive devices, good speed, no evidence for imbalance, normal gait    Steps  Alternating feet, no rail.    Total Score  28    FGA comment:  28/30        Adductor Stretch, Reclined (Strap, Wall)    Warm up with leg vertical. Rotate leg to side and fix foot to wall. Anchor opposite hip. Hold for _3-4___ breaths.  Bring leg back to middle and then across the body until you feel a stretch in your hip.  Hold 3-4 breaths. Repeat __2__ times each  leg.    Hamstring Stretch (Sitting)    Sitting, extend one leg and place hands on same thigh for support. Keeping torso straight, lean forward, sliding hands down leg, until a stretch is felt in back of thigh. Hold ____ seconds. Repeat with other leg.  Side Waist Stretch from Child's Pose    From child's pose, walk hands to left. Reach right hand out on diagonal. Reach hips back toward heels making a C with torso. Breathe into right side waist. Hold for __3-4__ breaths.   To stretch Left side transition to side sitting on Left hip, lean head to right, ribs out to left side.  Levator Scapula Stretch, Sitting    Sit, one hand tucked under hip on side to be stretched.. Turn head toward other side and look down. Use hand on head to gently stretch neck in that position. Hold _30__ seconds. Repeat _2-3__ times per session. Do __1-2_ sessions per day.  Abdominal Breathing Lying Down    Place one hand on stomach. Breathe in slowly through nose, letting  stomach rise. Breathe out gently through pursed lips, letting stomach fall. Breathe out for at least twice as long as you breathe in. Repeat _10___ times, __2__ times daily.  Bracing With Knee Fallout (Hook-Lying)    Breathe IN.  While breathing OUT- tighten pelvic floor and abdominals drop one knee out to side (20-30 deg). Keep opposite hip still.  Return to upright and repeat with other leg.   Repeat _10__ times each leg. Do _2__ times a day.  Bracing With Leg March (Hook-Lying)    Breathe IN.  While breathing OUT- tighten pelvic floor and abdominals and hold. Lift one foot _6-10__ inches and return to floor.  Repeat sequence with other leg lifting. Repeat _10__ times each leg. Do _2__ times a day.   Bracing With Heel Slides (Supine)    Breathe IN.  While breathing OUT- tighten pelvic floor and abdominals and hold. Slide one heel to bottom and back up.  Repeat sequence with other leg. Repeat _10__ times each leg. Do _2__ times a day.   EXTENSION: Supine - Elbow Flexed (Isometric)    Lie on back, knees bent both arm at side on surface, elbow bent. Breathing in, Press shoulder and upper arm down into surface, keeping elbow bent. Hold _2-3__ seconds. When breathing out relax shoulders and pull abdomen in. Repeat 12 times.   Bridge    Lie back, legs bent. Inhale, pressing hips up, press back of arms down into floor, shoulders down towards hips. Keeping ribs in, lengthen lower back. Exhale, rolling down along spine from top. Repeat __10__ times. Do __1-2__ sessions per day.  TO PROGRESS Bridge: Bridge with Marching: While hips are lifted, lift one foot off floor - hold 1-2 seconds, set it back down, lift the other foot and then back down.  Lower hips.   Lower Abdominal (Dead Bug): Strength    Lie on the flat floor, feet resting on a chair (90/90 hips/knees) Breathing out, back flat, lift Right leg off chair, lift left leg off chair maintain abdominal tension.  Lower  both slowly breathing in.  Repeat _10__ times. Do _1-2__ sessions per day.  To Progress Dead Bug: Lift both legs off chair and hold in the air while slowly lowering one leg back down to the chair, raise it back up, slowly lower other leg.  Repeat 12 times holding abdomen tight and keeping back flat.  Breathe!  Abductor Strength: Side Plank Pose, on  Knees    Keep the top leg straight.  Press down with bottom knee and elbow to lift hips and ribs. Hold for __1__ breaths. Repeat __10__ times right side; 3-5 on left side.  When on left side, place a thick rolled pillow under ribs and elbow.  Press through bottom knee, tuck ribs and then lift hips slightly.  Copyright  VHI. All rights reserved.      PT Education - 11/10/17 1126    Education provided  Yes    Education Details  final HEP, D/C today    Person(s) Educated  Patient    Methods  Explanation;Demonstration;Handout    Comprehension  Verbalized understanding;Returned demonstration       PT Short Term Goals - 10/12/17 1024      PT SHORT TERM GOAL #1   Title  Pt will participate in further balance and gait assessment with FGA     Time  6    Period  Weeks    Status  Achieved      PT SHORT TERM GOAL #2   Title  Pt will report 25% less pain in bilat shoulders and neck     Baseline  No change in shoulder pain; feels less tension and pain when she is away from her desk.  The longer she sits at the desk, the more tension/pain she has    Time  6    Period  Weeks    Status  Not Met      PT SHORT TERM GOAL #3   Title  Pt will demonstrate improved posture with more symmetrical trunk and shoulders and decreased L scapular winging    Baseline  R ribs more depressed now (less lateral flexion to L, L scapula more depressed, less winging and bilat scapula more retracted)    Time  6    Period  Weeks    Status  Achieved      PT SHORT TERM GOAL #4   Title  Pt will demonstrate 10 deg increase in L ankle DF ROM and L hamstring flexion ROM     Baseline  65 deg hamstring flexion, 0 deg ankle DF;  55 deg of ankle DF, 78 deg L hamstring flexion, 72 deg R hamstring flexion (SLR)    Time  6    Period  Weeks    Status  Partially Met        PT Long Term Goals - 11/10/17 7096      PT LONG TERM GOAL #1   Title  Pt will demonstrate independence with HEP and compliance with community wellness plan    Time  12    Period  Weeks    Status  Achieved      PT LONG TERM GOAL #2   Title  Pt will demonstrate improved balance during gait with 2-3 point increase in FGA     Baseline  28/30    Time  12    Period  Weeks    Status  Achieved      PT LONG TERM GOAL #3   Title  Pt will report 50% decrease in daily pain in shoulders and neck     Baseline  decreased by 15-20% overall, depends on work activities    Time  12    Period  Weeks    Status  Partially Met      PT LONG TERM GOAL #4   Title  Pt will demonstrate improved posture in sitting/standing with decreased forward  head, rounded shoulders, L scapular winging and L elevated shoulder with more symmetrical trunk/rib positions    Baseline  Improved forward head and shoulders more level, ribs more symmetrical; still presents with rounded shoulders, L scapular winging    Time  12    Period  Weeks    Status  Partially Met      PT LONG TERM GOAL #5   Title  Pt will ascend and descend stairs with alternating sequence, no rail with decreased circumduction when ascending and decreased L trunk lean when descending    Time  12    Period  Weeks    Status  Achieved            Plan - 11/10/17 1128    Clinical Impression Statement  Pt has made good progress and has met 3/5 LTG, demonstrating improvement and partially meeting the other two goals.  Pt demonstrates improvements in overall posture, core strength and dynamic standing balance and gait.  Pt also reports decreased pain and tension in neck and shoulder.  Pt provided with final core HEP to maintain strength and functional gains.   Pt to D/C from therapy today.    PT Treatment/Interventions  ADLs/Self Care Home Management;Aquatic Therapy;Electrical Stimulation;Moist Heat;Cryotherapy;Gait training;Stair training;Functional mobility training;Therapeutic activities;Therapeutic exercise;Balance training;Neuromuscular re-education;Patient/family education;Manual techniques;Passive range of motion;Dry needling;Taping    PT Next Visit Plan  D/C    Consulted and Agree with Plan of Care  Patient       Patient will benefit from skilled therapeutic intervention in order to improve the following deficits and impairments:  Abnormal gait, Decreased range of motion, Impaired tone, Impaired UE functional use, Postural dysfunction, Pain  Visit Diagnosis: Cerebral palsy, unspecified type ([facility])  Other symptoms and signs involving the nervous system  Chronic left shoulder pain  Abnormal posture  Other abnormalities of gait and mobility  Cervicalgia     Problem List Patient Active Problem List   Diagnosis Date Noted  . SVT (supraventricular tachycardia) ([place])    PHYSICAL THERAPY DISCHARGE SUMMARY  Visits from Start of Care: 9  Current functional level related to goals / functional outcomes: See LTG and impression statement above   Remaining deficits: Impaired posture, strength, balance   Education / Equipment: HEP  Plan: Patient agrees to discharge.  Patient goals were partially met. Patient is being discharged due to being pleased with the current functional level.  ?????     Rico Junker, PT, DPT 11/10/17    11:32 AM    [facility] [address] [place], [place], [address] Phone: [phone]   Fax:  [phone]  Name: Kathlee Barnhardt MRN: [id number] Date of Birth: [birth year]

## 2017-11-10 NOTE — Patient Instructions (Signed)
Adductor Stretch, Reclined (Strap, Wall)    Warm up with leg vertical. Rotate leg to side and fix foot to wall. Anchor opposite hip. Hold for _3-4___ breaths.  Bring leg back to middle and then across the body until you feel a stretch in your hip.  Hold 3-4 breaths. Repeat __2__ times each leg.    Hamstring Stretch (Sitting)    Sitting, extend one leg and place hands on same thigh for support. Keeping torso straight, lean forward, sliding hands down leg, until a stretch is felt in back of thigh. Hold ____ seconds. Repeat with other leg.  Side Waist Stretch from Child's Pose    From child's pose, walk hands to left. Reach right hand out on diagonal. Reach hips back toward heels making a C with torso. Breathe into right side waist. Hold for __3-4__ breaths.   To stretch Left side transition to side sitting on Left hip, lean head to right, ribs out to left side.  Levator Scapula Stretch, Sitting    Sit, one hand tucked under hip on side to be stretched.. Turn head toward other side and look down. Use hand on head to gently stretch neck in that position. Hold _30__ seconds. Repeat _2-3__ times per session. Do __1-2_ sessions per day.  Abdominal Breathing Lying Down    Place one hand on stomach. Breathe in slowly through nose, letting stomach rise. Breathe out gently through pursed lips, letting stomach fall. Breathe out for at least twice as long as you breathe in. Repeat _10___ times, __2__ times daily.  Bracing With Knee Fallout (Hook-Lying)    Breathe IN.  While breathing OUT- tighten pelvic floor and abdominals drop one knee out to side (20-30 deg). Keep opposite hip still.  Return to upright and repeat with other leg.   Repeat _10__ times each leg. Do _2__ times a day.  Bracing With Leg March (Hook-Lying)    Breathe IN.  While breathing OUT- tighten pelvic floor and abdominals and hold. Lift one foot _6-10__ inches and return to floor.  Repeat sequence with other  leg lifting. Repeat _10__ times each leg. Do _2__ times a day.   Bracing With Heel Slides (Supine)    Breathe IN.  While breathing OUT- tighten pelvic floor and abdominals and hold. Slide one heel to bottom and back up.  Repeat sequence with other leg. Repeat _10__ times each leg. Do _2__ times a day.   EXTENSION: Supine - Elbow Flexed (Isometric)    Lie on back, knees bent both arm at side on surface, elbow bent. Breathing in, Press shoulder and upper arm down into surface, keeping elbow bent. Hold _2-3__ seconds. When breathing out relax shoulders and pull abdomen in. Repeat 12 times.   Bridge    Lie back, legs bent. Inhale, pressing hips up, press back of arms down into floor, shoulders down towards hips. Keeping ribs in, lengthen lower back. Exhale, rolling down along spine from top. Repeat __10__ times. Do __1-2__ sessions per day.  TO PROGRESS Bridge: Bridge with Marching: While hips are lifted, lift one foot off floor - hold 1-2 seconds, set it back down, lift the other foot and then back down.  Lower hips.   Lower Abdominal (Dead Bug): Strength    Lie on the flat floor, feet resting on a chair (90/90 hips/knees) Breathing out, back flat, lift Right leg off chair, lift left leg off chair maintain abdominal tension.  Lower both slowly breathing in.  Repeat _10__ times. Do _1-2__ sessions  per day.  To Progress Dead Bug: Lift both legs off chair and hold in the air while slowly lowering one leg back down to the chair, raise it back up, slowly lower other leg.  Repeat 12 times holding abdomen tight and keeping back flat.  Breathe!  Abductor Strength: Side Plank Pose, on Knees    Keep the top leg straight.  Press down with bottom knee and elbow to lift hips and ribs. Hold for __1__ breaths. Repeat __10__ times right side; 3-5 on left side.  When on left side, place a thick rolled pillow under ribs and elbow.  Press through bottom knee, tuck ribs and then lift hips  slightly.  Copyright  VHI. All rights reserved.

## 2017-12-05 DIAGNOSIS — Z368A Encounter for antenatal screening for other genetic defects: Secondary | ICD-10-CM | POA: Diagnosis not present

## 2017-12-05 DIAGNOSIS — Z3201 Encounter for pregnancy test, result positive: Secondary | ICD-10-CM | POA: Diagnosis not present

## 2017-12-05 DIAGNOSIS — N912 Amenorrhea, unspecified: Secondary | ICD-10-CM | POA: Diagnosis not present

## 2017-12-22 DIAGNOSIS — O26891 Other specified pregnancy related conditions, first trimester: Secondary | ICD-10-CM | POA: Diagnosis not present

## 2017-12-22 DIAGNOSIS — Z113 Encounter for screening for infections with a predominantly sexual mode of transmission: Secondary | ICD-10-CM | POA: Diagnosis not present

## 2017-12-22 DIAGNOSIS — Z3A01 Less than 8 weeks gestation of pregnancy: Secondary | ICD-10-CM | POA: Diagnosis not present

## 2017-12-22 DIAGNOSIS — Z3A09 9 weeks gestation of pregnancy: Secondary | ICD-10-CM | POA: Diagnosis not present

## 2017-12-22 DIAGNOSIS — Z3682 Encounter for antenatal screening for nuchal translucency: Secondary | ICD-10-CM | POA: Diagnosis not present

## 2017-12-22 DIAGNOSIS — Z3689 Encounter for other specified antenatal screening: Secondary | ICD-10-CM | POA: Diagnosis not present

## 2017-12-22 DIAGNOSIS — O09511 Supervision of elderly primigravida, first trimester: Secondary | ICD-10-CM | POA: Diagnosis not present

## 2017-12-22 LAB — OB RESULTS CONSOLE HEPATITIS B SURFACE ANTIGEN: HEP B S AG: NEGATIVE

## 2017-12-22 LAB — OB RESULTS CONSOLE RPR: RPR: NONREACTIVE

## 2017-12-22 LAB — OB RESULTS CONSOLE ABO/RH: RH Type: POSITIVE

## 2017-12-22 LAB — OB RESULTS CONSOLE GC/CHLAMYDIA
CHLAMYDIA, DNA PROBE: NEGATIVE
GC PROBE AMP, GENITAL: NEGATIVE

## 2017-12-22 LAB — OB RESULTS CONSOLE ANTIBODY SCREEN: ANTIBODY SCREEN: NEGATIVE

## 2017-12-22 LAB — OB RESULTS CONSOLE RUBELLA ANTIBODY, IGM: Rubella: IMMUNE

## 2017-12-22 LAB — OB RESULTS CONSOLE HIV ANTIBODY (ROUTINE TESTING): HIV: NONREACTIVE

## 2018-01-16 DIAGNOSIS — Z3A12 12 weeks gestation of pregnancy: Secondary | ICD-10-CM | POA: Diagnosis not present

## 2018-01-16 DIAGNOSIS — Z3682 Encounter for antenatal screening for nuchal translucency: Secondary | ICD-10-CM | POA: Diagnosis not present

## 2018-01-16 DIAGNOSIS — O09511 Supervision of elderly primigravida, first trimester: Secondary | ICD-10-CM | POA: Diagnosis not present

## 2018-02-22 DIAGNOSIS — Z3A17 17 weeks gestation of pregnancy: Secondary | ICD-10-CM | POA: Diagnosis not present

## 2018-02-22 DIAGNOSIS — O09519 Supervision of elderly primigravida, unspecified trimester: Secondary | ICD-10-CM | POA: Diagnosis not present

## 2018-03-14 ENCOUNTER — Other Ambulatory Visit: Payer: Self-pay

## 2018-03-14 ENCOUNTER — Encounter (HOSPITAL_COMMUNITY): Payer: Self-pay | Admitting: *Deleted

## 2018-03-14 ENCOUNTER — Inpatient Hospital Stay (HOSPITAL_COMMUNITY)
Admission: AD | Admit: 2018-03-14 | Discharge: 2018-03-14 | Disposition: A | Discharge status: Home / Self Care | Payer: BLUE CROSS/BLUE SHIELD | Source: Ambulatory Visit | Attending: Obstetrics and Gynecology | Admitting: Obstetrics and Gynecology

## 2018-03-14 ENCOUNTER — Inpatient Hospital Stay (HOSPITAL_COMMUNITY): Payer: BLUE CROSS/BLUE SHIELD

## 2018-03-14 DIAGNOSIS — N1330 Unspecified hydronephrosis: Secondary | ICD-10-CM | POA: Diagnosis not present

## 2018-03-14 DIAGNOSIS — R109 Unspecified abdominal pain: Secondary | ICD-10-CM

## 2018-03-14 DIAGNOSIS — O9989 Other specified diseases and conditions complicating pregnancy, childbirth and the puerperium: Secondary | ICD-10-CM | POA: Diagnosis not present

## 2018-03-14 DIAGNOSIS — N1339 Other hydronephrosis: Secondary | ICD-10-CM | POA: Diagnosis not present

## 2018-03-14 DIAGNOSIS — Z3A20 20 weeks gestation of pregnancy: Secondary | ICD-10-CM | POA: Diagnosis not present

## 2018-03-14 DIAGNOSIS — R509 Fever, unspecified: Secondary | ICD-10-CM | POA: Diagnosis present

## 2018-03-14 LAB — COMPREHENSIVE METABOLIC PANEL
ALT: 27 U/L (ref 0–44)
AST: 22 U/L (ref 15–41)
Albumin: 3.3 g/dL — ABNORMAL LOW (ref 3.5–5.0)
Alkaline Phosphatase: 40 U/L (ref 38–126)
Anion gap: 11 (ref 5–15)
BUN: 10 mg/dL (ref 6–20)
CHLORIDE: 100 mmol/L (ref 98–111)
CO2: 19 mmol/L — ABNORMAL LOW (ref 22–32)
CREATININE: 0.53 mg/dL (ref 0.44–1.00)
Calcium: 9.1 mg/dL (ref 8.9–10.3)
GFR calc Af Amer: >60 mL/min (ref >60)
GFR calc non Af Amer: >60 mL/min (ref >60)
GLUCOSE: 85 mg/dL (ref 70–99)
POTASSIUM: 3.8 mmol/L (ref 3.5–5.1)
Sodium: 130 mmol/L — ABNORMAL LOW (ref 135–145)
Total Bilirubin: 0.6 mg/dL (ref 0.3–1.2)
Total Protein: 6.5 g/dL (ref 6.5–8.1)

## 2018-03-14 LAB — CBC
HEMATOCRIT: 31.3 % — AB (ref 36.0–46.0)
Hemoglobin: 10.6 g/dL — ABNORMAL LOW (ref 12.0–15.0)
MCH: 31.2 pg (ref 26.0–34.0)
MCHC: 33.9 g/dL (ref 30.0–36.0)
MCV: 92.1 fL (ref 78.0–100.0)
PLATELETS: 270 10*3/uL (ref 150–400)
RBC: 3.4 MIL/uL — ABNORMAL LOW (ref 3.87–5.11)
RDW: 13.3 % (ref 11.5–15.5)
WBC: 9.2 10*3/uL (ref 4.0–10.5)

## 2018-03-14 LAB — URINALYSIS, ROUTINE W REFLEX MICROSCOPIC
BILIRUBIN URINE: NEGATIVE
Glucose, UA: NEGATIVE mg/dL
Hgb urine dipstick: NEGATIVE
Ketones, ur: 20 mg/dL — AB
NITRITE: NEGATIVE
Protein, ur: NEGATIVE mg/dL
Specific Gravity, Urine: 1.005 (ref 1.005–1.030)
pH: 5 (ref 5.0–8.0)

## 2018-03-14 MED ORDER — IBUPROFEN 600 MG PO TABS
600.0000 mg | ORAL_TABLET | Freq: Once | ORAL | Status: AC
  Administered 2018-03-14: 600 mg via ORAL
  Filled 2018-03-14: qty 1

## 2018-03-14 MED ORDER — IBUPROFEN 600 MG PO TABS: 600.0000 mg | ORAL_TABLET | Freq: Three times a day (TID) | ORAL | 0 refills | Status: DC | PRN

## 2018-03-14 NOTE — MAU Provider Note (Addendum)
History  CSN: [id number] Arrival date and time: 03/14/18 1843    Chief Complaint  Patient presents with  . Fever  . Back Pain    HPI: Ceasar LundKrista Murtaugh is a 37 y.o. G1P0 with IUP at [id number] who presents to maternity admissions reporting back pain and fever. She reports that she has had some back pain this pregnancy, but this pain back pain started 2 days ago and it has been constant, on her right mid-back; pain does not radiate. They today she had a temperature of 100.14F around 5pm. Reports she took tylenol before coming to help with pain as this was getting pretty intense. She reports some increase in frequency in urination, but states that she has been more thirsty recently and has been drinking more water. Denies dysuria, hematuria, or urgency. Also denies any nausea, vomiting, abdominal pain, abnormal vaginal discharge, vaginal bleeding, malaise, cough, SOB, or URI symptoms.   She reports her pregnancy has been uncomplicated. Only PMH is CP (with some left-sided weakness), and remote h/o SVT.  OB History  Gravida Para Term Preterm AB Living  1            SAB TAB Ectopic Multiple Live Births               # Outcome Date GA Lbr Len/2nd Weight Sex Delivery Anes PTL Lv  1 Current            Past Medical History:  Diagnosis Date  . Cerebral palsy (HCC)    Involving left leg and arm  . SVT (supraventricular tachycardia) (HCC)    Started in her 7520s   History reviewed. No pertinent surgical history. Family History  Family history unknown: Yes   Social History   Socioeconomic History  . Marital status: Married    Spouse name: Not on file  . Number of children: Not on file  . Years of education: Not on file  . Highest education level: Not on file  Occupational History  . Not on file  Social Needs  . Financial resource strain: Not on file  . Food insecurity:    Worry: Not on file    Inability: Not on file  . Transportation needs:    Medical: Not on file    Non-medical: Not on  file  Tobacco Use  . Smoking status: Never Smoker  . Smokeless tobacco: Never Used  Substance and Sexual Activity  . Alcohol use: No  . Drug use: No  . Sexual activity: Yes  Lifestyle  . Physical activity:    Days per week: Not on file    Minutes per session: Not on file  . Stress: Not on file  Relationships  . Social connections:    Talks on phone: Not on file    Gets together: Not on file    Attends religious service: Not on file    Active member of club or organization: Not on file    Attends meetings of clubs or organizations: Not on file    Relationship status: Not on file  . Intimate partner violence:    Fear of current or ex partner: Not on file    Emotionally abused: Not on file    Physically abused: Not on file    Forced sexual activity: Not on file  Other Topics Concern  . Not on file  Social History Narrative  . Not on file   No Known Allergies  Medications Prior to Admission  Medication Sig Dispense Refill Last Dose  .  levonorgestrel-ethinyl estradiol (AVIANE,ALESSE,LESSINA) 0.1-20 MG-MCG tablet Take 1 tablet by mouth daily.   Taking  . propranolol (INDERAL) 10 MG tablet Take 10 mg by mouth daily as needed. For SVT  1 Taking    I have reviewed patient's Past Medical Hx, Surgical Hx, Family Hx, Social Hx, medications and allergies.   Review of Systems: Negative except for what is mentioned in HPI.  Physical Exam   Blood pressure (!) 109/47, pulse 91, temperature 98.3 F (36.8 C), temperature source Oral, resp. rate 18, height 5\' 3"  (1.6 m), weight 137 lb 12 oz (62.5 kg), last menstrual period 10/20/2017, SpO2 100 %.  Constitutional: Well-developed, well-nourished female in no acute distress.  HENT: [place]/AT, normal oropharynx mucosa. MMM Eyes: normal conjunctivae, no scleral icterus Cardiovascular: normal rate, regular rhythm Respiratory: normal effort, lungs CTAB.  GI: Abd soft, non-tender, gravid appropriate for gestational age.   GU: Neg CVAT  bilaterally Pelvic: deffered MSK: Extremities nontender, no edema Neurologic: Alert and oriented x 4. Psych: Normal mood and affect Skin: warm and dry   FHT: 155  MAU Course/MDM:   Nursing notes and VS reviewed. Patient seen and examined, as noted above.  UA, CBC, CMP ordered.  Results reviewed:  Results for orders placed or performed during the hospital encounter of 03/14/18  Urinalysis, Routine w reflex microscopic  Result Value Ref Range   Color, Urine STRAW (A) YELLOW   APPearance CLEAR CLEAR   Specific Gravity, Urine 1.005 1.005 - 1.030   pH 5.0 5.0 - 8.0   Glucose, UA NEGATIVE NEGATIVE mg/dL   Hgb urine dipstick NEGATIVE NEGATIVE   Bilirubin Urine NEGATIVE NEGATIVE   Ketones, ur 20 (A) NEGATIVE mg/dL   Protein, ur NEGATIVE NEGATIVE mg/dL   Nitrite NEGATIVE NEGATIVE   Leukocytes, UA TRACE (A) NEGATIVE   RBC / HPF 0-5 0 - 5 RBC/hpf   WBC, UA 0-5 0 - 5 WBC/hpf   Bacteria, UA FEW (A) NONE SEEN   Squamous Epithelial / LPF 0-5 0 - 5  CBC  Result Value Ref Range   WBC 9.2 4.0 - 10.5 K/uL   RBC 3.40 (L) 3.87 - 5.11 MIL/uL   Hemoglobin 10.6 (L) 12.0 - 15.0 g/dL   HCT 40.9 (L) 81.1 - 91.4 %   MCV 92.1 78.0 - 100.0 fL   MCH 31.2 26.0 - 34.0 pg   MCHC 33.9 30.0 - 36.0 g/dL   RDW 78.2 95.6 - 21.3 %   Platelets 270 150 - 400 K/uL  Comprehensive metabolic panel  Result Value Ref Range   Sodium 130 (L) 135 - 145 mmol/L   Potassium 3.8 3.5 - 5.1 mmol/L   Chloride 100 98 - 111 mmol/L   CO2 19 (L) 22 - 32 mmol/L   Glucose, Bld 85 70 - 99 mg/dL   BUN 10 6 - 20 mg/dL   Creatinine, Ser 0.86 0.44 - 1.00 mg/dL   Calcium 9.1 8.9 - 57.8 mg/dL   Total Protein 6.5 6.5 - 8.1 g/dL   Albumin 3.3 (L) 3.5 - 5.0 g/dL   AST 22 15 - 41 U/L   ALT 27 0 - 44 U/L   Alkaline Phosphatase 40 38 - 126 U/L   Total Bilirubin 0.6 0.3 - 1.2 mg/dL   GFR calc non Af Amer >60 >60 mL/min   GFR calc Af Amer >60 >60 mL/min   Anion gap 11 5 - 15   US Renal  Result Date: 03/14/2018 CLINICAL DATA:   Right flank pain. EXAM: RENAL /  URINARY TRACT ULTRASOUND COMPLETE COMPARISON:  None. FINDINGS: Right Kidney: Length: 11 cm.  Moderate hydronephrosis.  No stones or masses. Left Kidney: Length: 10.3 cm. Echogenicity within normal limits. No mass or hydronephrosis visualized. Bladder: Appears normal for degree of bladder distention. IMPRESSION: 1. There is moderate hydronephrosis on the right. It is possible the hydronephrosis could be due to the patient's gravid uterus lying on the right ureter. A ureteral stone is not excluded on this study. Electronically Signed   By: Gerome Sam III M.D   On: 03/14/2018 20:50    Care handed over to [place], [place].  Kandra Nicolas. [place], MD OB Fellow 03/14/2018, 8:17 PM    Reviewed patient with Dr. Mindi Slicker. Will have patient take ibuprofen x 48 hrs & increase fluids. Pt has f/u in office tomorrow. Pt to call office if fever returns or pain no better.  Pt given first dose of ibuprofen prior to discharge.  Assessment and Plan  Assessment: 1. Pregnancy with hydronephrosis in second trimester   2. Right flank pain   3. [redacted] weeks gestation of pregnancy     Plan: Discharge home Increase water intake Call office for worsening symptoms, fever, or vomiting Urine culture pending Ibuprofen x 48 hrs  Judeth Horn, NP

## 2018-03-14 NOTE — MAU Note (Addendum)
Pt presents with c/o aching all over but more intense in lower back.  Reports temp 100.8 @ 1700, has since Tylenol.  Denies VB, cramping, or LOF.  Reports +FM.  Denies dysuria or burning, reports frequency.

## 2018-03-14 NOTE — Discharge Instructions (Signed)
Hydronephrosis Hydronephrosis is the enlargement of a kidney due to a blockage that stops urine from flowing out of the body. What are the causes? Common causes of this condition include:  A birth (congenital) defect of the kidney.  A congenital defect of the tube through which urine travels (ureter).  Kidney stones.  An enlarged prostate gland.  A tumor.  Cancer of the prostate, bladder, uterus, ovary, or colon.  A blood clot.  What are the signs or symptoms? Symptoms of this condition include:  Pain or discomfort in your side (flank).  Swelling of the abdomen.  Pain in the abdomen.  Nausea and vomiting.  Fever.  Pain while passing urine.  Feeling of urgency to urinate.  Frequent urination.  Infection of the urinary tract.  In some cases, there are no symptoms. How is this diagnosed? This condition may be diagnosed with:  A medical history.  A physical exam.  Blood and urine tests to check kidney function.  Imaging tests, such as an X-ray, ultrasound, CT scan, or MRI.  A test in which a rigid or flexible telescope (cystoscope) is used to view the site of the blockage.  How is this treated? Treatment for this condition depends on where the blockage is located, how long it has been there, and what caused it. The goal of treatment is to remove the blockage. Treatment options include:  A procedure to put in a soft tube to help drain urine.  Antibiotic medicines to treat or prevent infection.  Shock-wave therapy (lithotripsy) to help eliminate kidney stones.  Follow these instructions at home:  Get lots of rest.  Drink enough fluid to keep your urine clear or pale yellow.  If you have a drain in, follow your health care provider's instructions about how to care for it.  Take medicines only as directed by your health care provider.  If you were prescribed an antibiotic medicine, finish all of it even if you start to feel better.  Keep all  follow-up visits as directed by your health care provider. This is important. Contact a health care provider if:  You continue to have symptoms after treatment.  You develop new symptoms.  You have a problem with a drainage device.  Your urine becomes cloudy or bloody.  You have a fever. Get help right away if:  You have severe flank or abdominal pain.  You develop vomiting and are unable to keep fluids down. This information is not intended to replace advice given to you by your health care provider. Make sure you discuss any questions you have with your health care provider. Document Released: 06/20/2007 Document Revised: 01/29/2016 Document Reviewed: 08/19/2014 Elsevier Interactive Patient Education  2018 Elsevier Inc.  

## 2018-03-16 LAB — CULTURE, OB URINE

## 2018-04-25 DIAGNOSIS — Z3689 Encounter for other specified antenatal screening: Secondary | ICD-10-CM | POA: Diagnosis not present

## 2018-05-17 DIAGNOSIS — Z23 Encounter for immunization: Secondary | ICD-10-CM | POA: Diagnosis not present

## 2018-06-21 ENCOUNTER — Ambulatory Visit: Payer: BLUE CROSS/BLUE SHIELD | Attending: Obstetrics and Gynecology | Admitting: Occupational Therapy

## 2018-06-21 ENCOUNTER — Encounter: Payer: Self-pay | Admitting: Occupational Therapy

## 2018-06-21 ENCOUNTER — Other Ambulatory Visit: Payer: Self-pay

## 2018-06-21 DIAGNOSIS — R278 Other lack of coordination: Secondary | ICD-10-CM

## 2018-06-21 DIAGNOSIS — G8114 Spastic hemiplegia affecting left nondominant side: Secondary | ICD-10-CM | POA: Diagnosis not present

## 2018-06-21 DIAGNOSIS — R29818 Other symptoms and signs involving the nervous system: Secondary | ICD-10-CM

## 2018-06-21 DIAGNOSIS — R293 Abnormal posture: Secondary | ICD-10-CM | POA: Diagnosis not present

## 2018-06-21 NOTE — Therapy (Signed)
[facility] [address] [place], [place], [address] Phone: [phone]   Fax:  [phone]  Occupational Therapy Evaluation  Patient Details  Name: Debra King MRN: [id number] Date of Birth: 1981/01/03 Referring Provider (OT): Dr. Nikki Dom   Encounter Date: 06/21/2018  OT End of Session - 06/21/18 1401    Visit Number  1    Number of Visits  13    Date for OT Re-Evaluation  10/19/18    Authorization Type  [employer]--awaiting insurance verification    OT Start Time  1100    OT Stop Time  1145    OT Time Calculation (min)  45 min    Activity Tolerance  Patient tolerated treatment well    Behavior During Therapy  [facility] for tasks assessed/performed       Past Medical History:  Diagnosis Date  . Cerebral palsy (HCC)    Involving left leg and arm  . SVT (supraventricular tachycardia) (HCC)    Started in her 26s    History reviewed. No pertinent surgical history.  There were no vitals filed for this visit.  Subjective Assessment - 06/21/18 1104    Subjective   I wanted suggestions for breastfeeding and caring for the baby.  I took a labor/delivery and breastfeeding class, but they really didn't have many suggestions    Pertinent History  [redacted] weeks pregnant (07/27/18 due date); Cerebral Palsy (CP) with L hemiparesis, SVT    Patient Stated Goals  I wanted suggestions for breastfeeding and caring for the baby    Currently in Pain?  No/denies        [facility] OT Assessment - 06/21/18 0001      Assessment   Medical Diagnosis  Cerebral Palsy, pregnant     Referring Provider (OT)  Dr. Augusto Gamble Bovard-Stukeert    Onset Date/Surgical Date  06/15/18   MD Order date   Hand Dominance  Right    Prior Therapy  PT earlier this year      Precautions   Precautions  --   pregnant     Balance Screen   Has the patient fallen in the past 6 months  No      Home  Environment   Family/patient expects to be discharged to:   Private residence    Home Layout  Two level    Lives With  Spouse      Prior Function   Level of Independence  Independent   mod I   Vocation  --   works as a Printmaker, walks dog      ADL   ADL comments  BADLs and current IADLs mod I.  Pt is [redacted] weeks pregnant and presents with concerns/desire to have strategies for breastfeeding and caring for baby once she delivers (due to L hemiparesis due to CP).  This is her first baby.      Mobility   Mobility Status  Independent    Mobility Status Comments  L hemiparesis with abnormal gait, but ambulates without device      Written Expression   Dominant Hand  Right      Cognition   Overall Cognitive Status  Within Functional Limits for tasks assessed      Posture/Postural Control   Posture/Postural Control  Postural limitations    Posture Comments  L trunk shortening, rounded L shoulder      Coordination   Coordination  Pt able to perform gross finger flex/extension with  L hand      Tone   Assessment Location  Left Upper Extremity      ROM / Strength   AROM / PROM / Strength  AROM;Strength      AROM   Overall AROM   Deficits    Overall AROM Comments  Pt with shoulder IR and elbow flex with reach, shoulder flex grossly WFL with compensatory patterns, pt is able to extend elbow [facility] with effort, pt with no wrist movement or ability to supinate/pronate.  Pt is able to grossly flex/extend fingers but is unable to perform pinch.      Strength   Overall Strength  Deficits    Overall Strength Comments  R shoulder flex, abduction, biceps, triceps grossly 4 to 4+/5      LUE Tone   LUE Tone  --   LUE rests in flexor synergy pattern                  OT Education - 06/21/18 2115    Education Details  Began Problem-solving/Education regarding adaptive strategies for breastfeeding (positioning, nursing pillows), using baby carrier, picking up baby, changing diaper, carrying car seat, environmental  modifications/considerations    Person(s) Educated  Patient    Methods  Explanation;Demonstration    Comprehension  Verbalized understanding       OT Short Term Goals - 06/21/18 2136      OT SHORT TERM GOAL #1   Title  Pt will be able to position baby for proper latch and breastfeeding session with min A.    Status  New      OT SHORT TERM GOAL #2   Title  Pt will be able to change diaper mod I.    Status  New      OT SHORT TERM GOAL #3   Title  Pt will be able to dress/undress baby mod I.    Status  New        OT Long Term Goals - 06/21/18 2130      OT LONG TERM GOAL #1   Title  Pt will verbalize understanding of positioning/modifications and AE to assist with care for baby for incr safety, independence, and ease.    Status  New      OT LONG TERM GOAL #2   Title  Pt will be able to position baby for proper latch and breastfeeding session independently.    Status  New      OT LONG TERM GOAL #3   Title  Pt will be able to don baby carrier with baby mod I.    Status  New      OT LONG TERM GOAL #4   Title  Pt will be able to lift baby in/out of bassinet, pack-in-play, and car seat mod I.    Status  New      OT LONG TERM GOAL #5   Title  Pt will be independent with stretches/HEP prn to decr risk of pain/injury from lifting/caring for baby.    Status  New      OT LONG TERM GOAL #6   Title  --    Status  --      OT LONG TERM GOAL #7   Title  --    Status  --            Plan - 06/21/18 2117    Clinical Impression Statement  Pt is a 37 y.o. female who is pregnant with hx of cerebral palsy.  Pt was referred to occupational therapy for strategies for breastfeeding and baby care with L hemiparesis.  Pt with PMH that also includes SVT.  Pt presents today with L hemiparesis with decr LUE functional use, decr strength, decr ROM, decr coordination, spasticity.  Pt would benefit from occupational therapy for education/trial of adaptive strategies for breastfeeding and  baby care in prep for and once pt delivers baby to incr independence and ease for pt and incr safety for baby.    Occupational Profile and client history currently impacting functional performance  Pt is mod I with current ADLs and IADLs, but is [redacted] weeks pregnant and expecting baby boy (due 07/27/18).  Pt would benefit from education regarding strategies for baby care in prep for and once baby comes as this will be a new IADL.  Pt has attended typical breastfeeding and labor/deliver classes with spouse, but was not educated in strategies/modification for L hemiparesis.    Occupational performance deficits (Please refer to evaluation for details):  ADL's;IADL's;Leisure;Play;Social Participation    Rehab Potential  Good    OT Frequency  --   eval + 12 visit x 4 months to address needs/challenges with baby care    OT Treatment/Interventions  Self-care/ADL training;Therapeutic exercise;DME and/or AE instruction;Functional Mobility Training;Balance training;Splinting;Manual Therapy;Neuromuscular education;Moist Heat;Energy conservation;Passive range of motion;Therapeutic activities;Patient/family education;Cryotherapy    Plan  continue with education regarding strategies/modifications and AE for baby care and breastfeeding    Clinical Decision Making  Several treatment options, min-mod task modification necessary    Consulted and Agree with Plan of Care  Patient       Patient will benefit from skilled therapeutic intervention in order to improve the following deficits and impairments:  Decreased balance, Decreased mobility, Impaired tone, Improper body mechanics, Decreased range of motion, Decreased coordination, Decreased knowledge of use of DME, Decreased strength, Impaired flexibility, Impaired UE functional use, Improper spinal/pelvic alignment  Visit Diagnosis: Spastic hemiplegia affecting left nondominant side, unspecified etiology (HCC)  Abnormal posture  Other lack of coordination  Other  symptoms and signs involving the nervous system    Problem List Patient Active Problem List   Diagnosis Date Noted  . SVT (supraventricular tachycardia) (HCC)     [facility] 06/21/2018, 9:41 PM  [facility] [address] [place], [place], [address] Phone: [phone]   Fax:  [phone]  Name: Debra King MRN: [id number] Date of Birth: 04/06/1981   Willa Frater, OTR/L [facility] [address] [place], [place]  [address] [phone] phone [phone] 06/21/18 9:41 PM

## 2018-06-29 DIAGNOSIS — Z3685 Encounter for antenatal screening for Streptococcus B: Secondary | ICD-10-CM | POA: Diagnosis not present

## 2018-06-29 LAB — OB RESULTS CONSOLE GBS: GBS: NEGATIVE

## 2018-07-11 ENCOUNTER — Inpatient Hospital Stay (HOSPITAL_COMMUNITY): Payer: BLUE CROSS/BLUE SHIELD | Admitting: Anesthesiology

## 2018-07-11 ENCOUNTER — Inpatient Hospital Stay (HOSPITAL_COMMUNITY)
Admission: AD | Admit: 2018-07-11 | Discharge: 2018-07-13 | DRG: 806 | Disposition: A | Discharge status: Home / Self Care | Payer: BLUE CROSS/BLUE SHIELD | Attending: Obstetrics and Gynecology | Admitting: Obstetrics and Gynecology

## 2018-07-11 ENCOUNTER — Encounter (HOSPITAL_COMMUNITY): Payer: Self-pay

## 2018-07-11 ENCOUNTER — Other Ambulatory Visit: Payer: Self-pay

## 2018-07-11 DIAGNOSIS — Z3A37 37 weeks gestation of pregnancy: Secondary | ICD-10-CM

## 2018-07-11 DIAGNOSIS — O4290 Premature rupture of membranes, unspecified as to length of time between rupture and onset of labor, unspecified weeks of gestation: Secondary | ICD-10-CM | POA: Diagnosis present

## 2018-07-11 DIAGNOSIS — G809 Cerebral palsy, unspecified: Secondary | ICD-10-CM | POA: Diagnosis not present

## 2018-07-11 DIAGNOSIS — O4292 Full-term premature rupture of membranes, unspecified as to length of time between rupture and onset of labor: Secondary | ICD-10-CM | POA: Diagnosis not present

## 2018-07-11 DIAGNOSIS — O99354 Diseases of the nervous system complicating childbirth: Secondary | ICD-10-CM | POA: Diagnosis present

## 2018-07-11 DIAGNOSIS — N433 Hydrocele, unspecified: Secondary | ICD-10-CM | POA: Diagnosis not present

## 2018-07-11 DIAGNOSIS — Z23 Encounter for immunization: Secondary | ICD-10-CM | POA: Diagnosis not present

## 2018-07-11 DIAGNOSIS — K429 Umbilical hernia without obstruction or gangrene: Secondary | ICD-10-CM | POA: Diagnosis not present

## 2018-07-11 LAB — CBC
HEMATOCRIT: 33.4 % — AB (ref 36.0–46.0)
Hemoglobin: 11 g/dL — ABNORMAL LOW (ref 12.0–15.0)
MCH: 29.7 pg (ref 26.0–34.0)
MCHC: 32.9 g/dL (ref 30.0–36.0)
MCV: 90.3 fL (ref 80.0–100.0)
PLATELETS: 321 10*3/uL (ref 150–400)
RBC: 3.7 MIL/uL — ABNORMAL LOW (ref 3.87–5.11)
RDW: 13.4 % (ref 11.5–15.5)
WBC: 10.7 10*3/uL — AB (ref 4.0–10.5)
nRBC: 0 % (ref 0.0–0.2)

## 2018-07-11 LAB — TYPE AND SCREEN
ABO/RH(D): A POS
ANTIBODY SCREEN: NEGATIVE

## 2018-07-11 LAB — POCT FERN TEST: POCT Fern Test: POSITIVE

## 2018-07-11 LAB — ABO/RH: ABO/RH(D): A POS

## 2018-07-11 MED ORDER — DIPHENHYDRAMINE HCL 25 MG PO CAPS: 25.0000 mg | ORAL_CAPSULE | Freq: Four times a day (QID) | ORAL | Status: DC | PRN

## 2018-07-11 MED ORDER — TETANUS-DIPHTH-ACELL PERTUSSIS 5-2.5-18.5 LF-MCG/0.5 IM SUSP: 0.5000 mL | Freq: Once | INTRAMUSCULAR | Status: DC

## 2018-07-11 MED ORDER — LIDOCAINE-EPINEPHRINE (PF) 2 %-1:200000 IJ SOLN
INTRAMUSCULAR | Status: DC | PRN
  Administered 2018-07-11: 3 mL via EPIDURAL
  Administered 2018-07-11: 4 mL via EPIDURAL

## 2018-07-11 MED ORDER — SIMETHICONE 80 MG PO CHEW: 80.0000 mg | CHEWABLE_TABLET | ORAL | Status: DC | PRN

## 2018-07-11 MED ORDER — BENZOCAINE-MENTHOL 20-0.5 % EX AERO: 1.0000 "application " | INHALATION_SPRAY | CUTANEOUS | Status: DC | PRN

## 2018-07-11 MED ORDER — ONDANSETRON HCL 4 MG/2ML IJ SOLN: 4.0000 mg | Freq: Four times a day (QID) | INTRAMUSCULAR | Status: DC | PRN

## 2018-07-11 MED ORDER — ONDANSETRON HCL 4 MG PO TABS: 4.0000 mg | ORAL_TABLET | ORAL | Status: DC | PRN

## 2018-07-11 MED ORDER — OXYCODONE-ACETAMINOPHEN 5-325 MG PO TABS: 2.0000 | ORAL_TABLET | ORAL | Status: DC | PRN

## 2018-07-11 MED ORDER — DIPHENHYDRAMINE HCL 50 MG/ML IJ SOLN: 12.5000 mg | INTRAMUSCULAR | Status: DC | PRN

## 2018-07-11 MED ORDER — ONDANSETRON HCL 4 MG/2ML IJ SOLN: 4.0000 mg | INTRAMUSCULAR | Status: DC | PRN

## 2018-07-11 MED ORDER — WITCH HAZEL-GLYCERIN EX PADS: 1.0000 "application " | MEDICATED_PAD | CUTANEOUS | Status: DC | PRN

## 2018-07-11 MED ORDER — FENTANYL 2.5 MCG/ML BUPIVACAINE 1/10 % EPIDURAL INFUSION (WH - ANES)
14.0000 mL/h | INTRAMUSCULAR | Status: DC | PRN
  Administered 2018-07-11: 14 mL/h via EPIDURAL
  Filled 2018-07-11: qty 100

## 2018-07-11 MED ORDER — PRENATAL MULTIVITAMIN CH
1.0000 | ORAL_TABLET | Freq: Every day | ORAL | Status: DC
  Filled 2018-07-11 (×2): qty 1

## 2018-07-11 MED ORDER — OXYTOCIN BOLUS FROM INFUSION
500.0000 mL | Freq: Once | INTRAVENOUS | Status: AC
  Administered 2018-07-11: 500 mL via INTRAVENOUS

## 2018-07-11 MED ORDER — FLEET ENEMA 7-19 GM/118ML RE ENEM: 1.0000 | ENEMA | RECTAL | Status: DC | PRN

## 2018-07-11 MED ORDER — EPHEDRINE 5 MG/ML INJ
10.0000 mg | INTRAVENOUS | Status: DC | PRN
  Filled 2018-07-11: qty 2

## 2018-07-11 MED ORDER — DIBUCAINE 1 % RE OINT: 1.0000 "application " | TOPICAL_OINTMENT | RECTAL | Status: DC | PRN

## 2018-07-11 MED ORDER — ACETAMINOPHEN 325 MG PO TABS: 650.0000 mg | ORAL_TABLET | ORAL | Status: DC | PRN

## 2018-07-11 MED ORDER — PHENYLEPHRINE 40 MCG/ML (10ML) SYRINGE FOR IV PUSH (FOR BLOOD PRESSURE SUPPORT)
80.0000 ug | PREFILLED_SYRINGE | INTRAVENOUS | Status: DC | PRN
  Filled 2018-07-11: qty 10
  Filled 2018-07-11: qty 5

## 2018-07-11 MED ORDER — OXYTOCIN 40 UNITS IN LACTATED RINGERS INFUSION - SIMPLE MED: 2.5000 [IU]/h | INTRAVENOUS | Status: DC

## 2018-07-11 MED ORDER — OXYCODONE-ACETAMINOPHEN 5-325 MG PO TABS: 1.0000 | ORAL_TABLET | ORAL | Status: DC | PRN

## 2018-07-11 MED ORDER — IBUPROFEN 600 MG PO TABS
600.0000 mg | ORAL_TABLET | Freq: Four times a day (QID) | ORAL | Status: DC
  Administered 2018-07-12 – 2018-07-13 (×6): 600 mg via ORAL
  Filled 2018-07-11 (×6): qty 1

## 2018-07-11 MED ORDER — LACTATED RINGERS IV SOLN: 500.0000 mL | INTRAVENOUS | Status: DC | PRN

## 2018-07-11 MED ORDER — LACTATED RINGERS IV SOLN
INTRAVENOUS | Status: DC
  Administered 2018-07-11: 13:00:00 via INTRAVENOUS

## 2018-07-11 MED ORDER — LACTATED RINGERS IV SOLN: 500.0000 mL | Freq: Once | INTRAVENOUS | Status: DC

## 2018-07-11 MED ORDER — LIDOCAINE HCL (PF) 1 % IJ SOLN
30.0000 mL | INTRAMUSCULAR | Status: DC | PRN
  Filled 2018-07-11: qty 30

## 2018-07-11 MED ORDER — TERBUTALINE SULFATE 1 MG/ML IJ SOLN
0.2500 mg | Freq: Once | INTRAMUSCULAR | Status: DC | PRN
  Filled 2018-07-11: qty 1

## 2018-07-11 MED ORDER — SOD CITRATE-CITRIC ACID 500-334 MG/5ML PO SOLN
30.0000 mL | ORAL | Status: DC | PRN
  Administered 2018-07-11: 30 mL via ORAL
  Filled 2018-07-11: qty 15

## 2018-07-11 MED ORDER — ZOLPIDEM TARTRATE 5 MG PO TABS: 5.0000 mg | ORAL_TABLET | Freq: Every evening | ORAL | Status: DC | PRN

## 2018-07-11 MED ORDER — SENNOSIDES-DOCUSATE SODIUM 8.6-50 MG PO TABS
2.0000 | ORAL_TABLET | ORAL | Status: DC
  Administered 2018-07-12 (×2): 2 via ORAL
  Filled 2018-07-11 (×2): qty 2

## 2018-07-11 MED ORDER — OXYTOCIN 40 UNITS IN LACTATED RINGERS INFUSION - SIMPLE MED
1.0000 m[IU]/min | INTRAVENOUS | Status: DC
  Administered 2018-07-11: 2 m[IU]/min via INTRAVENOUS
  Filled 2018-07-11: qty 1000

## 2018-07-11 MED ORDER — PHENYLEPHRINE 40 MCG/ML (10ML) SYRINGE FOR IV PUSH (FOR BLOOD PRESSURE SUPPORT)
80.0000 ug | PREFILLED_SYRINGE | INTRAVENOUS | Status: DC | PRN
  Filled 2018-07-11: qty 5

## 2018-07-11 MED ORDER — COCONUT OIL OIL: 1.0000 "application " | TOPICAL_OIL | Status: DC | PRN

## 2018-07-11 NOTE — MAU Note (Signed)
?   Leaking around 2000. ?leaking vs mucous like d/c.  Pooling noted in office. No bleeding. 1/70 when checked.

## 2018-07-11 NOTE — Progress Notes (Signed)
Patient ID: Debra King, female   DOB: [birth year], 37 y.o.   MRN: [id number] Pt on pitocin and got uncomfortable, just received epidural and feeling much better +LOF   FHR category 1 for most part, occasional intervals with mild variable decels that resolved with position change.  Great variability  Cervix 80/4/-1 IUPC placed to adjust pitocin Good progress, continue to follow

## 2018-07-11 NOTE — Anesthesia Procedure Notes (Signed)
Epidural Patient location during procedure: OB Start time: 07/11/2018 4:45 PM End time: 07/11/2018 5:00 PM  Staffing Anesthesiologist: Elmer Picker, MD Performed: anesthesiologist   Preanesthetic Checklist Completed: patient identified, pre-op evaluation, timeout performed, IV checked, risks and benefits discussed and monitors and equipment checked  Epidural Patient position: sitting Prep: site prepped and draped and DuraPrep Patient monitoring: continuous pulse ox, blood pressure, heart rate and cardiac monitor Approach: midline Location: L3-L4 Injection technique: LOR air  Needle:  Needle type: Tuohy  Needle gauge: 17 G Needle length: 9 cm Needle insertion depth: 5 cm Catheter type: closed end flexible Catheter size: 19 Gauge Catheter at skin depth: 10 cm Test dose: negative  Assessment Sensory level: T8 Events: blood not aspirated, injection not painful, no injection resistance, negative IV test and no paresthesia  Additional Notes Patient identified. Risks/Benefits/Options discussed with patient including but not limited to bleeding, infection, nerve damage, paralysis, failed block, incomplete pain control, headache, blood pressure changes, nausea, vomiting, reactions to medication both or allergic, itching and postpartum back pain. Confirmed with bedside nurse the patient's most recent platelet count. Confirmed with patient that they are not currently taking any anticoagulation, have any bleeding history or any family history of bleeding disorders. Patient expressed understanding and wished to proceed. All questions were answered. Sterile technique was used throughout the entire procedure. Please see nursing notes for vital signs. Test dose was given through epidural catheter and negative prior to continuing to dose epidural or start infusion. Warning signs of high block given to the patient including shortness of breath, tingling/numbness in hands, complete motor block,  or any concerning symptoms with instructions to call for help. Patient was given instructions on fall risk and not to get out of bed. All questions and concerns addressed with instructions to call with any issues or inadequate analgesia.  Reason for block:procedure for pain

## 2018-07-11 NOTE — Progress Notes (Signed)
Patient ID: Debra King, female   DOB: 12/27/1980, 37 y.o.   MRN: [id number] Pt began to have more persistent variable decels and a few prolonged decels so I placed an FSE  Cervix c/8+/0 OP  Pt placed in exaggerated Sims position Pitocin turned off and will follow MVU FHR currently back to 130-140 baseline and mild variables with contractions  Will follow FHR closely and if variables return try amnioinfusion Restart pitocin if contraction MVU drop below 200

## 2018-07-11 NOTE — Anesthesia Preprocedure Evaluation (Signed)
Anesthesia Evaluation  Patient identified by MRN, date of birth, ID band Patient awake    Reviewed: Allergy & Precautions, NPO status , Patient's Chart, lab work & pertinent test results  Airway Mallampati: I  TM Distance: >3 FB Neck ROM: Full    Dental no notable dental hx. (+) Teeth Intact   Pulmonary neg pulmonary ROS,    Pulmonary exam normal breath sounds clear to auscultation       Cardiovascular Normal cardiovascular exam+ dysrhythmias (on propanolol) Supra Ventricular Tachycardia  Rhythm:Regular Rate:Normal     Neuro/Psych CP negative neurological ROS  negative psych ROS   GI/Hepatic negative GI ROS, Neg liver ROS,   Endo/Other  negative endocrine ROS  Renal/GU negative Renal ROS  negative genitourinary   Musculoskeletal negative musculoskeletal ROS (+)   Abdominal   Peds negative pediatric ROS (+)  Hematology negative hematology ROS (+)   Anesthesia Other Findings   Reproductive/Obstetrics negative OB ROS                             Anesthesia Physical Anesthesia Plan  ASA: III  Anesthesia Plan: Epidural   Post-op Pain Management:    Induction:   PONV Risk Score and Plan: Treatment may vary due to age or medical condition  Airway Management Planned: Natural Airway  Additional Equipment:   Intra-op Plan:   Post-operative Plan:   Informed Consent: I have reviewed the patients History and Physical, chart, labs and discussed the procedure including the risks, benefits and alternatives for the proposed anesthesia with the patient or authorized representative who has indicated his/her understanding and acceptance.     Plan Discussed with: Anesthesiologist  Anesthesia Plan Comments: (Patient identified. Risks, benefits, options discussed with patient including but not limited to bleeding, infection, nerve damage, paralysis, failed block, incomplete pain control,  headache, blood pressure changes, nausea, vomiting, reactions to medication, itching, and post partum back pain. Confirmed with bedside nurse the patient's most recent platelet count. Confirmed with the patient that they are not taking any anticoagulation, have any bleeding history or any family history of bleeding disorders. Patient expressed understanding and wishes to proceed. All questions were answered. )        Anesthesia Quick Evaluation

## 2018-07-11 NOTE — H&P (Signed)
Debra King is a 37 y.o. female G1P0 at 37 5/7 weeks (EDD 07/27/18 by LMP c/w 9 week [place])  presenting for c/o possible LOF beginning last PM at 2200, followed by mild contractions q 10 min.  Pt did not call to report LOF until today and was confirmed ruptured by +pool, nitrazine and fern.   Prenatal care significant for h/o cerebral palsy with left upper extremity mostly affected.  Also AMA but had low risk panorama, NT was high normal but MFM felt no additional w/u needed with negative Panorama.   H/o mild SVT but stable and no meds needed.   OB History    Gravida  1   Para      Term      Preterm      AB      Living        SAB      TAB      Ectopic      Multiple      Live Births             Past Medical History:  Diagnosis Date  . Cerebral palsy (HCC)    Involving left leg and arm  . SVT (supraventricular tachycardia) (HCC)    Started in her 56s   Past Surgical History:  Procedure Laterality Date  . NO PAST SURGERIES     Family History: Family history is unknown by patient. Social History:  reports that she has never smoked. She has never used smokeless tobacco. She reports that she does not drink alcohol or use drugs.     Maternal Diabetes: No Genetic Screening: Normal Maternal Ultrasounds/Referrals: Abnormal:  Findings:   Other:NT ULN Fetal Ultrasounds or other Referrals:  None Maternal Substance Abuse:  No Significant Maternal Medications:  None Significant Maternal Lab Results:  None Other Comments:  None  Review of Systems  Cardiovascular: Negative for chest pain.  Gastrointestinal: Positive for abdominal pain.  Musculoskeletal: Negative for back pain.   History   Blood pressure 123/85, pulse 93, temperature 98.6 F (37 C), temperature source Oral, resp. rate 17, height 5\' 3"  (1.6 m), weight 67.4 kg, last menstrual period 10/20/2017, SpO2 98 %. Exam Physical Exam  Prenatal labs: ABO, Rh: A/Positive/-- (04/18 0000) Antibody: Negative (04/18  0000) Rubella: Immune (04/18 0000) RPR: Nonreactive (04/18 0000)  HBsAg: Negative (04/18 0000)  HIV: Non-reactive (04/18 0000)  GBS: Negative (10/24 0000)  One hour GCT 133 Essential panel negative Panorama low risk, female  Assessment/Plan: Pt with confirmed PROM.  D/w her need for admission and pitocin augmentation and she is agreeable.   IV pain meds and epidural as desired. Oliver Pila 07/11/2018, 12:38 PM

## 2018-07-11 NOTE — Anesthesia Pain Management Evaluation Note (Signed)
  CRNA Pain Management Visit Note  Patient: Debra King, 37 y.o., female  "Hello I am a member of the anesthesia team at [facility]. We have an anesthesia team available at all times to provide care throughout the hospital, including epidural management and anesthesia for C-section. I don't know your plan for the delivery whether it a natural birth, water birth, IV sedation, nitrous supplementation, doula or epidural, but we want to meet your pain goals."   1.Was your pain managed to your expectations on prior hospitalizations?   No prior hospitalizations  2.What is your expectation for pain management during this hospitalization?     Epidural  3.How can we help you reach that goal? *support**  Record the patient's initial score and the patient's pain goal.   Pain: 1  Pain Goal: 6 [facility] wants you to be able to say your pain was always managed very well.  Trellis Paganini 07/11/2018

## 2018-07-12 LAB — CBC
HEMATOCRIT: 32.1 % — AB (ref 36.0–46.0)
Hemoglobin: 10.7 g/dL — ABNORMAL LOW (ref 12.0–15.0)
MCH: 30.1 pg (ref 26.0–34.0)
MCHC: 33.3 g/dL (ref 30.0–36.0)
MCV: 90.2 fL (ref 80.0–100.0)
Platelets: 277 10*3/uL (ref 150–400)
RBC: 3.56 MIL/uL — AB (ref 3.87–5.11)
RDW: 13.2 % (ref 11.5–15.5)
WBC: 16.1 10*3/uL — AB (ref 4.0–10.5)
nRBC: 0 % (ref 0.0–0.2)

## 2018-07-12 LAB — RPR: RPR: NONREACTIVE

## 2018-07-12 NOTE — Progress Notes (Signed)
PPD #1 No problems Afeb, VSS Fundus firm, NT at U-1 Continue routine postpartum care 

## 2018-07-12 NOTE — Anesthesia Postprocedure Evaluation (Signed)
Anesthesia Post Note  Patient: Debra King  Procedure(s) Performed: AN AD HOC LABOR EPIDURAL     Patient location during evaluation: Mother Baby Anesthesia Type: Epidural Level of consciousness: awake and alert Pain management: pain level controlled Vital Signs Assessment: post-procedure vital signs reviewed and stable Respiratory status: spontaneous breathing, nonlabored ventilation and respiratory function stable Cardiovascular status: stable Postop Assessment: no headache, no backache and epidural receding Anesthetic complications: no    Last Vitals:  Vitals:   07/12/18 0058 07/12/18 0438  BP: 117/71 113/70  Pulse: 71 65  Resp: 18   Temp: 36.8 C 36.9 C  SpO2:      Last Pain:  Vitals:   07/12/18 0444  TempSrc:   PainSc: 0-No pain   Pain Goal: Patients Stated Pain Goal: 5 (07/11/18 1335)               Junious Silk

## 2018-07-12 NOTE — Lactation Note (Signed)
This note was copied from a baby's chart. Lactation Consultation Note  Patient Name: Debra Debra King Date: 07/12/2018 Reason for consult: Initial assessment;Early term 37-38.6wks;1st time breastfeeding;Primapara;Other (Comment)(mom has limited mobility left hand )  Baby is 80 hours old , mom has a hx of cerebral palsy effecting her left side / hand and leg.  It had been 3 hours and baby had been STS for 60 mins after bath. Per mom had pumped off  Milk.  LC checked diaper/ noted to be dry/ and assisted mom to latch on the left breast football.  LC stacked 2 pillows and a folded bath blanket ( like a 2X2 ) closest to moms side to offer her firm support. Mom only able to hold baby close with her forearm, and then LC assisted to latch and obtain depth. Baby opened wide and latch with FISH lips and was sluggish at 1st and then with breast compressions was in a consistent swallowing pattern, increased with breast compressions. Mom able to use her right hand for breast compressions.  Dad was present and LC showed him how to assist mom, position pillows, and obtain depth, and be aware of the swallows. The swallows were impressive for a 16 hour old infant.   Per mom both her breast have been leaking since 20 weeks , breast changed in size, areola darker, and sensitive with pregnancy. Mom has been able to pumped off EBM several times since birth - 11 ml total.  LC showed dad how to wash the pump pieces.  Per mom has DEBP Spectra at home.  Mother informed of post-discharge support and given phone number to the lactation department, including services for phone call assistance; out-patient appointments; and breastfeeding support group. List of other breastfeeding resources in the community given in the handout. Encouraged mother to call for problems or concerns related to breastfeeding.  Both mom and dad receptive to teaching , and mentioned they both had attended the breast feeding class.  Mother  informed of post-discharge support and given phone number to the lactation department, including services for phone call assistance; out-patient appointments; and breastfeeding support group. List of other breastfeeding resources in the community given in the handout. Encouraged mother to call for problems or concerns related to breastfeeding.    Maternal Data Has patient been taught Hand Expression?: Yes(mom able to return demo ) Does the patient have breastfeeding experience prior to this delivery?: No  Feeding Feeding Type: Breast Fed  LATCH Score Latch: Grasps breast easily, tongue down, lips flanged, rhythmical sucking.  Audible Swallowing: Spontaneous and intermittent  Type of Nipple: Everted at rest and after stimulation  Comfort (Breast/Nipple): Filling, red/small blisters or bruises, mild/mod discomfort  Hold (Positioning): Assistance needed to correctly position infant at breast and maintain latch.  LATCH Score: 8  Interventions Interventions: Breast feeding basics reviewed;Assisted with latch;Skin to skin;Breast massage;Hand express;Breast compression;Adjust position;Support pillows;Position options;Expressed milk;DEBP  Lactation Tools Discussed/Used Tools: Pump Breast pump type: Double-Electric Breast Pump WIC Program: No Pump Review: Setup, frequency, and cleaning;Milk Storage Initiated by:: MAI / reviewed  Date initiated:: 07/12/18   Consult Status Consult Status: Follow-up Date: 07/13/18 Follow-up type: In-patient    Debra King 07/12/2018, 2:55 PM

## 2018-07-13 ENCOUNTER — Encounter (HOSPITAL_COMMUNITY): Payer: Self-pay | Admitting: *Deleted

## 2018-07-13 MED ORDER — IBUPROFEN 600 MG PO TABS: 600.0000 mg | ORAL_TABLET | Freq: Four times a day (QID) | ORAL | 1 refills | Status: AC | PRN

## 2018-07-13 MED ORDER — PRENATAL VITAMIN 27-0.8 MG PO TABS: 1.0000 | ORAL_TABLET | Freq: Every day | ORAL | 3 refills | Status: AC

## 2018-07-13 NOTE — Lactation Note (Signed)
This note was copied from a baby's chart. Lactation Consultation Note  Patient Name: Debra King [id number] Date: 07/13/2018 Reason for consult: Follow-up assessment;Early term 37-38.6wks;Primapara;1st time breastfeeding  Baby is 38 hours  LC reviewed and updated the doc flow sheets per dad .  Moms milk is in and breast are full , not engorged. LC had mom pre-pump with  Hand pump . Mom able to use hand pump well and was able to pump off fullness So areola compressible for a deep latch.  Baby awake and hungry/ its been 3 hours/ baby 's diaper dry, baby STS on the left breast. Mom needed assist to obtain the depth, and easy the chin down for FISH lips.  Mom denies soreness. Sore nipple and engorgement prevention and tx reviewed.  Mom will have hand pump for D/C ( #24 F fits well ) and her DEBP   Spectra at  Home) .  Per dad my working schedule will accommodate so I will be home with mom majority  Of time so I will be able to assist.  LC discussed the importance of watching the baby for nutritive vs non -nutritive feeding patterns/ and watch for hanging out latched.  LC stressed the importance of STS feedings until the baby can stay awake for a feeding, and is gaining steadily.  LC recommended if the hand pump becomes a challenges due to decrease mobility, to consider purchasing a [place]  hand pump due to it being easier.  LC also recommended considering coming back for [facility] appt. In about 1 week, parents  Will call.  Mother informed of post-discharge support and given phone number to the lactation department, including services for phone call assistance; out-patient appointments; and breastfeeding support group. List of other breastfeeding resources in the community given in the handout. Encouraged mother to call for problems or concerns related to breastfeeding.    Maternal Data Has patient been taught Hand Expression?: Yes  Feeding Feeding Type: Breast Fed  LATCH Score Latch:  Grasps breast easily, tongue down, lips flanged, rhythmical sucking.  Audible Swallowing: Spontaneous and intermittent  Type of Nipple: Everted at rest and after stimulation  Comfort (Breast/Nipple): Soft / non-tender  Hold (Positioning): Assistance needed to correctly position infant at breast and maintain latch.  LATCH Score: 9  Interventions Interventions: Breast feeding basics reviewed;Assisted with latch;Skin to skin;Breast massage;Breast compression;Pre-pump if needed;Hand express;Adjust position;Support pillows;Position options  Lactation Tools Discussed/Used Tools: Pump Breast pump type: Manual Pump Review: Setup, frequency, and cleaning;Milk Storage   Consult Status Consult Status: Complete Date: 07/13/18    Matilde Sprang Myrella Fahs 07/13/2018, 11:30 AM

## 2018-07-13 NOTE — Discharge Summary (Signed)
OB Discharge Summary     Patient Name: Debra King DOB: 1981-07-11 MRN: [id number]  Date of admission: 07/11/2018 Delivering MD: Huel Cote   Date of discharge: 07/13/2018  Admitting diagnosis: 37wks water broke ctx 10 mins Intrauterine pregnancy: [redacted]w[redacted]d     Secondary diagnosis:  Active Problems:   Premature rupture of membranes   NSVD (normal spontaneous vaginal delivery)  Additional problems: maternal mild CP     Discharge diagnosis: Term Pregnancy Delivered                                                                                                Post partum procedures:N/A  Augmentation: Pitocin  Complications: None  Hospital course:  Onset of Labor With Vaginal Delivery     37 y.o. yo G1P0 at [redacted]w[redacted]d was admitted in Latent Labor on 07/11/2018. Patient had an uncomplicated labor course as follows:  Membrane Rupture Time/Date: 8:30 PM ,07/10/2018   Intrapartum Procedures: Episiotomy: None [1]                                         Lacerations:  2nd degree [3]  Patient had a delivery of a Viable infant. 07/11/2018  Information for the patient's newborn:  Oanh, Devivo [[id number]]  Delivery Method: Vag-Spont    Pateint had an uncomplicated postpartum course.  She is ambulating, tolerating a regular diet, passing flatus, and urinating well. Patient is discharged home in stable condition on 07/13/18.   Physical exam  Vitals:   07/12/18 1637 07/12/18 1640 07/12/18 2247 07/13/18 0620  BP: 117/83 117/83 111/79 119/79  Pulse: 79 78 77 75  Resp: 16 18 18 18   Temp: 98 F (36.7 C) 98 F (36.7 C)  97.9 F (36.6 C)  TempSrc: Oral Oral  Oral  SpO2:    100%  Weight:      Height:       General: alert and no distress Lochia: appropriate Uterine Fundus: firm  Labs: Lab Results  Component Value Date   WBC 16.1 (H) 07/12/2018   HGB 10.7 (L) 07/12/2018   HCT 32.1 (L) 07/12/2018   MCV 90.2 07/12/2018   PLT 277 07/12/2018   CMP Latest Ref Rng & Units  03/14/2018  Glucose 70 - 99 mg/dL 85  BUN 6 - 20 mg/dL 10  Creatinine 7.82 - 9.56 mg/dL 2.13  Sodium 086 - 578 mmol/L 130(L)  Potassium 3.5 - 5.1 mmol/L 3.8  Chloride 98 - 111 mmol/L 100  CO2 22 - 32 mmol/L 19(L)  Calcium 8.9 - 10.3 mg/dL 9.1  Total Protein 6.5 - 8.1 g/dL 6.5  Total Bilirubin 0.3 - 1.2 mg/dL 0.6  Alkaline Phos 38 - 126 U/L 40  AST 15 - 41 U/L 22  ALT 0 - 44 U/L 27    Discharge instruction: per After Visit Summary and "Baby and Me Booklet".  After visit meds:  Allergies as of 07/13/2018   No Known Allergies     Medication List    TAKE these medications  calcium carbonate 500 MG chewable tablet Commonly known as:  TUMS - dosed in mg elemental calcium Chew 1 tablet by mouth daily.   ibuprofen 600 MG tablet Commonly known as:  ADVIL,MOTRIN Take 1 tablet (600 mg total) by mouth every 6 (six) hours as needed. What changed:  when to take this   Prenatal Vitamin 27-0.8 MG Tabs Take 1 tablet by mouth at bedtime. What changed:    medication strength  how much to take  when to take this       Diet: routine diet  Activity: Advance as tolerated. Pelvic rest for 6 weeks.   Outpatient follow up:6 weeks Follow up Appt: Future Appointments  Date Time Provider Department Center  08/10/2018 11:00 AM Marlis Edelson, OT [facility]   Follow up Visit:No follow-ups on file.  Postpartum contraception: Undecided  Newborn Data: Live born female  Birth Weight: 6 lb 8.8 oz (2970 g) APGAR: 7, 9  Newborn Delivery   Birth date/time:  07/11/2018 21:09:00 Delivery type:  Vaginal, Spontaneous     Baby Feeding: Breast Disposition:home with mother   07/13/2018 Sherian Rein, MD

## 2018-07-13 NOTE — Progress Notes (Signed)
Post Partum Day 2 Subjective: no complaints, up ad lib, voiding, tolerating PO and nl lochia, pain controlled  Objective: Blood pressure 119/79, pulse 75, temperature 97.9 F (36.6 C), temperature source Oral, resp. rate 18, height 5\' 3"  (1.6 m), weight 67.6 kg, last menstrual period 10/20/2017, SpO2 100 %.  Physical Exam:  General: alert and no distress Lochia: appropriate Uterine Fundus: firm  Recent Labs    07/11/18 1259 07/12/18 0525  HGB 11.0* 10.7*  HCT 33.4* 32.1*    Assessment/Plan: Discharge home, Breastfeeding and Lactation consult.  Routine care.  D/c with motrin and PNV.  F/u 6 weeks.     LOS: 2 days   Debra King 07/13/2018, 7:47 AM

## 2018-07-14 DIAGNOSIS — Z00110 Health examination for newborn under 8 days old: Secondary | ICD-10-CM | POA: Diagnosis not present

## 2018-08-10 ENCOUNTER — Ambulatory Visit: Payer: BLUE CROSS/BLUE SHIELD | Admitting: Occupational Therapy

## 2018-08-23 DIAGNOSIS — Z1389 Encounter for screening for other disorder: Secondary | ICD-10-CM | POA: Diagnosis not present

## 2018-08-23 DIAGNOSIS — Z124 Encounter for screening for malignant neoplasm of cervix: Secondary | ICD-10-CM | POA: Diagnosis not present

## 2018-08-23 DIAGNOSIS — Z3009 Encounter for other general counseling and advice on contraception: Secondary | ICD-10-CM | POA: Diagnosis not present

## 2018-08-28 ENCOUNTER — Encounter: Payer: Self-pay | Admitting: Occupational Therapy

## 2018-08-28 NOTE — Therapy (Signed)
[facility] [address] [place], [place], [address] Phone: [phone]   Fax:  [phone]  Patient Details  Name: Debra King MRN: [id number] Date of Birth: 09/04/1981 Referring Provider:  No ref. provider found  Encounter Date: 08/28/2018   OCCUPATIONAL THERAPY DISCHARGE SUMMARY  Visits from Start of Care: 1 (eval only)  Current functional level related to goals / functional outcomes:         OT Long Term Goals - 06/21/18 2130            OT LONG TERM GOAL #1   Title  Pt will verbalize understanding of positioning/modifications and AE to assist with care for baby for incr safety, independence, and ease.    Status Not fully met        OT LONG TERM GOAL #2   Title  Pt will be able to position baby for proper latch and breastfeeding session independently.    Status Not able to rassess       OT LONG TERM GOAL #3   Title  Pt will be able to don baby carrier with baby mod I.    Status Not able to reassess        OT LONG TERM GOAL #4   Title  Pt will be able to lift baby in/out of bassinet, pack-in-play, and car seat mod I.    Status  Not able to reassess        OT LONG TERM GOAL #5   Title  Pt will be independent with stretches/HEP prn to decr risk of pain/injury from lifting/caring for baby.    Status  Not able to reassess    LTGs not met/unable to reassess due to pt not returning to OT after evaluation   Remaining deficits: L hemiparesis with decr LUE functional use, decr strength, decr ROM, decr coordination, spasticity--see eval   Education / Equipment:  Began Problem-solving/Education regarding adaptive strategies for breastfeeding (positioning, nursing pillows), using baby carrier, picking up baby, changing diaper, carrying car seat, environmental modifications/considerations.  Unable to complete pt education due to pt not returning to OT.   Plan: Patient agrees to  discharge.  Patient goals were not met. Patient is being discharged due to not returning since the last visit.  Pt cancelled follow-up appointment after baby was born due to financial/insurance coverage concerns.  Pt left message stating that OT was not covered by insurance.  ?????       [facility] 08/28/2018, 8:13 AM  [facility] [address] [place], [place], [address] Phone: [phone]   Fax:  Roberts, OTR/L [facility] [address] [place], [place]  [address] [phone] phone [phone] 08/28/18 8:13 AM

## 2018-09-18 DIAGNOSIS — Z0000 Encounter for general adult medical examination without abnormal findings: Secondary | ICD-10-CM | POA: Diagnosis not present

## 2018-09-18 DIAGNOSIS — G5601 Carpal tunnel syndrome, right upper limb: Secondary | ICD-10-CM | POA: Diagnosis not present

## 2018-09-18 DIAGNOSIS — Z5181 Encounter for therapeutic drug level monitoring: Secondary | ICD-10-CM | POA: Diagnosis not present

## 2018-09-18 DIAGNOSIS — E785 Hyperlipidemia, unspecified: Secondary | ICD-10-CM | POA: Diagnosis not present

## 2019-03-14 IMAGING — US US RENAL
1 series · 16 of 25 positions shown · non-contrast
Comparison: None.

CLINICAL DATA: Right flank pain.

EXAM:
RENAL / URINARY TRACT ULTRASOUND COMPLETE

[Series 1: us renal · 40 acquisitions, 16 frames shown]
[im 1/40]
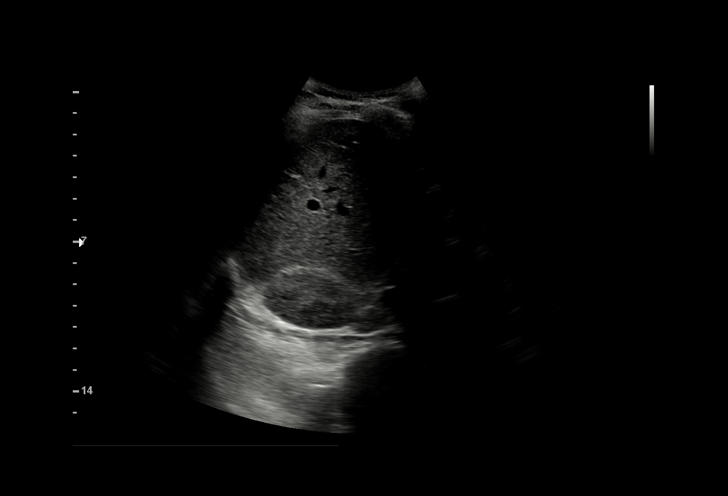
[im 4/40]
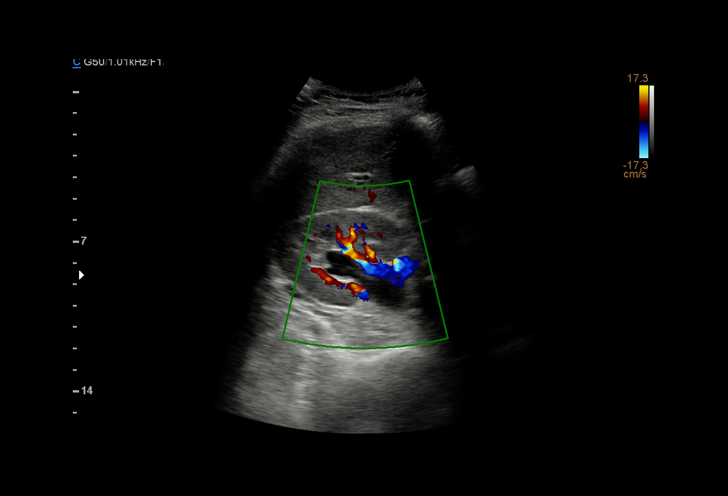
[im 5/40]
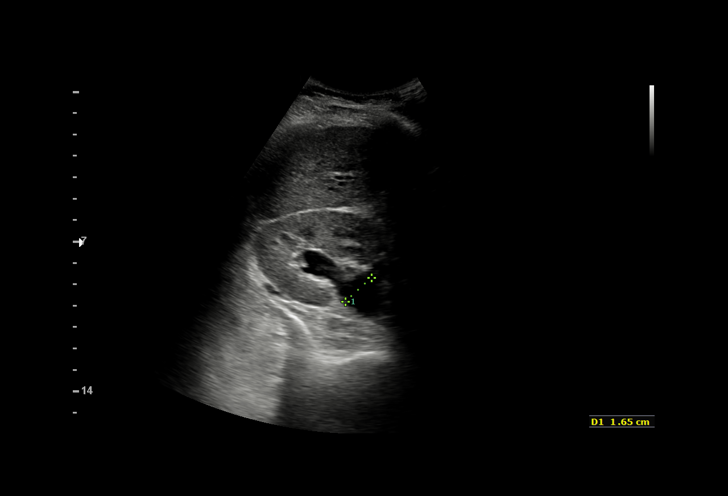
[im 9/40]
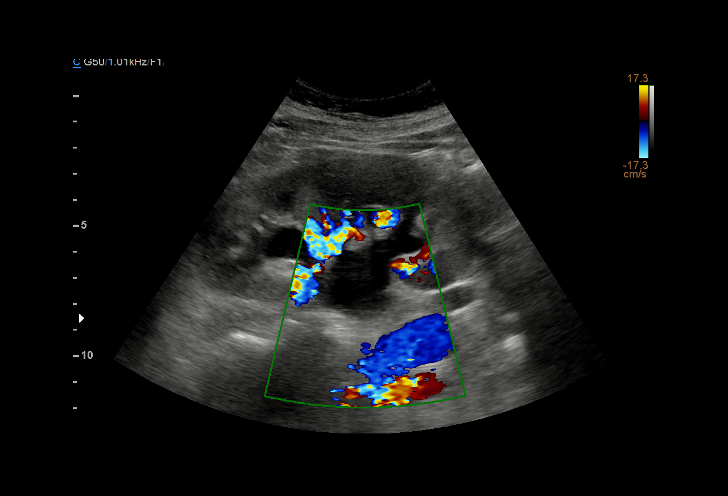
[im 12/40]
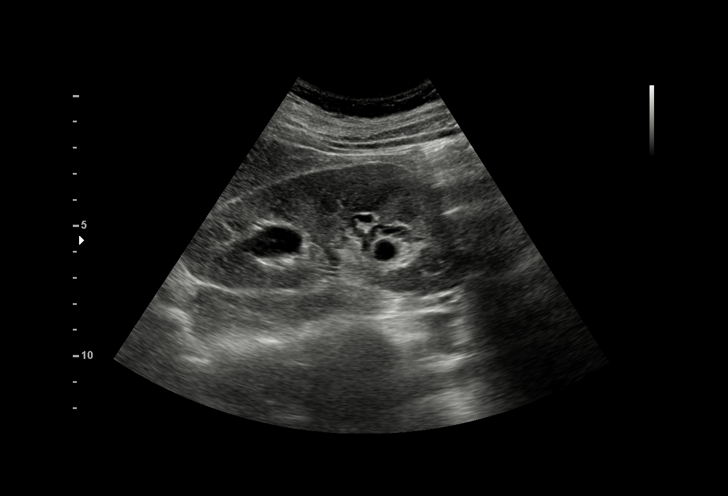
[im 14/40]
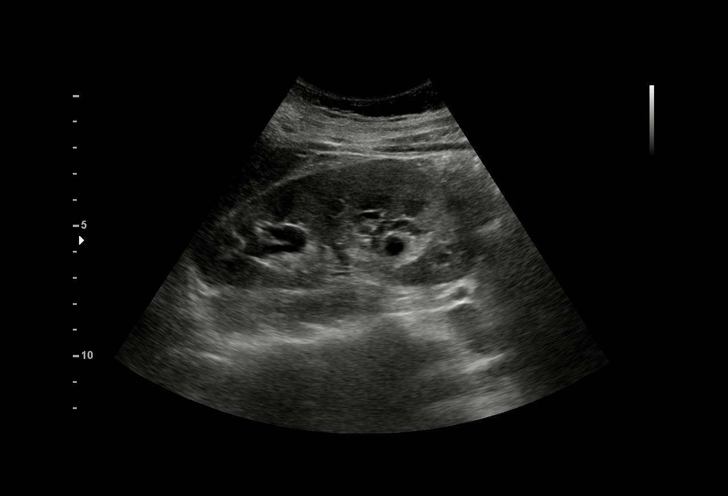
[im 17/40]
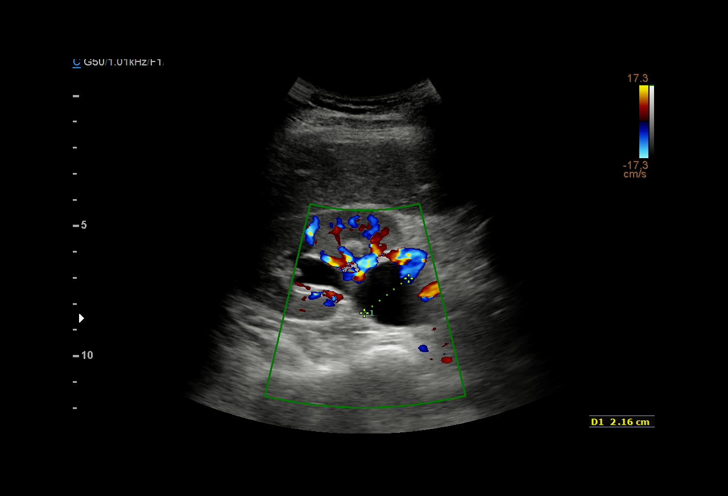
[im 18/40]
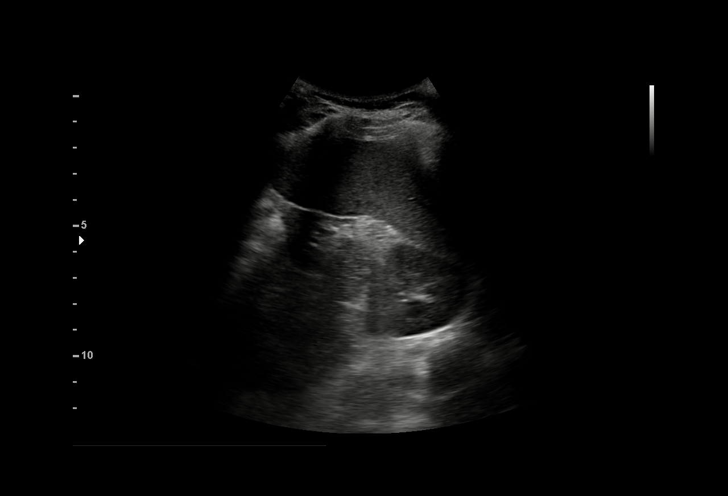
[im 22/40]
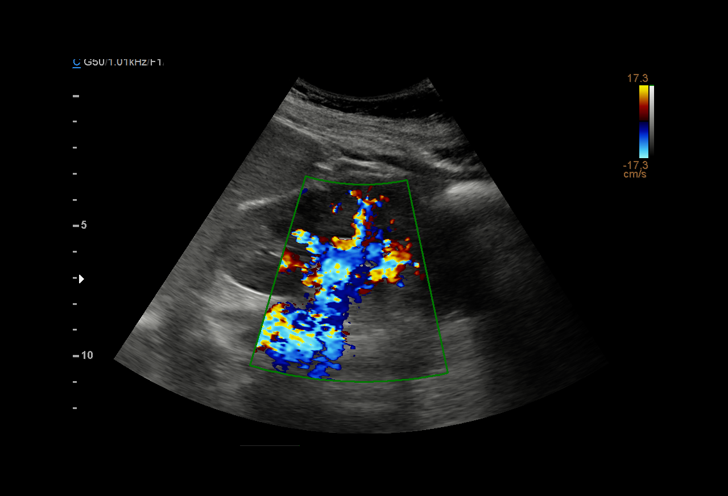
[im 23/40]
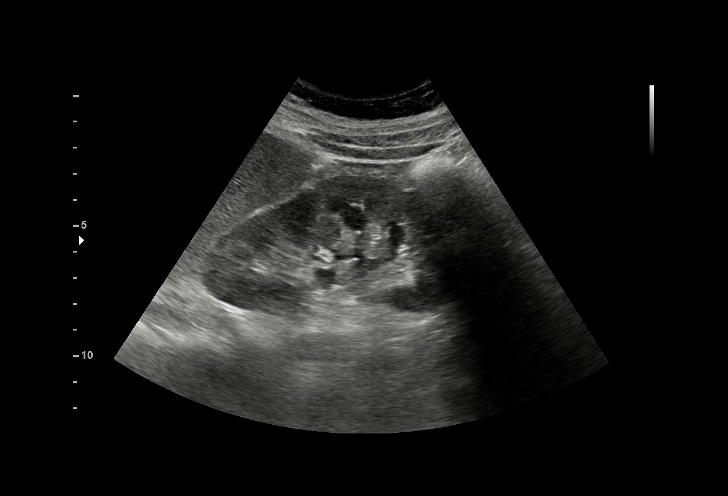
[im 27/40]
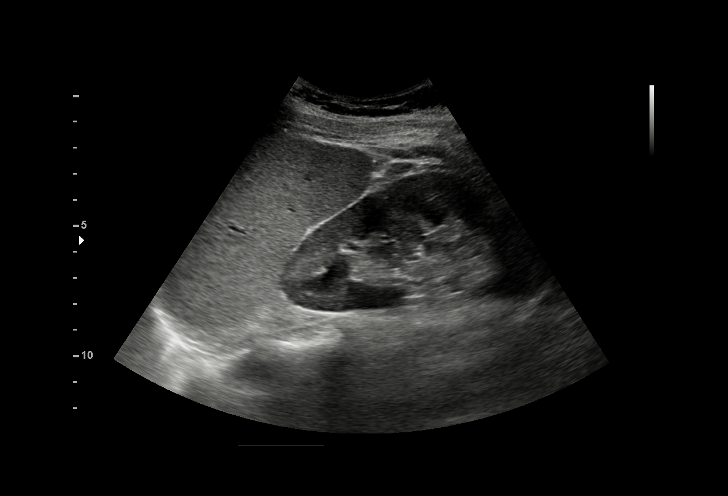
[im 28/40]
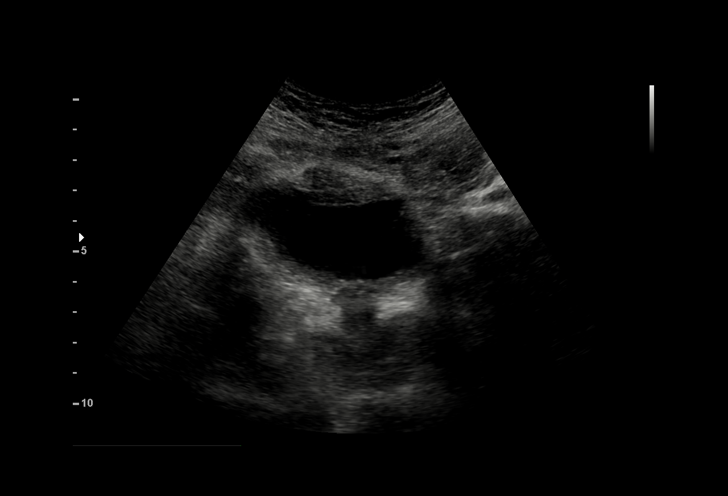
[im 31/40]
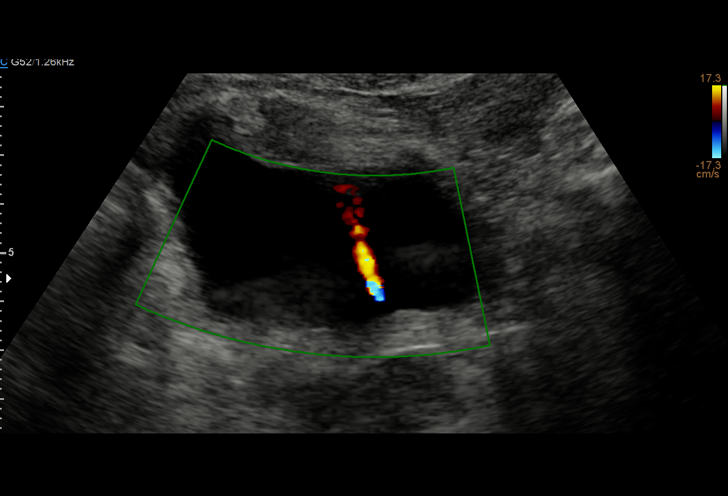
[im 35/40]
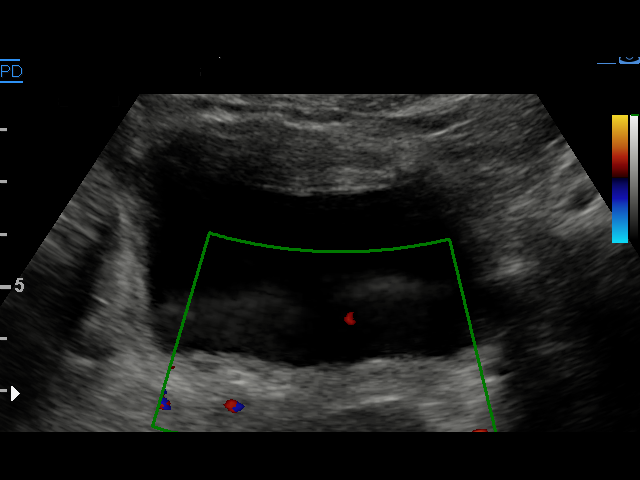
[im 36/40]
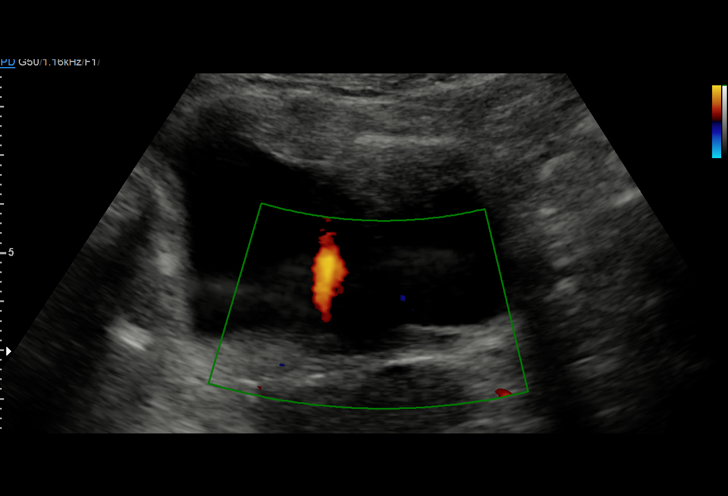
[im 40/40]
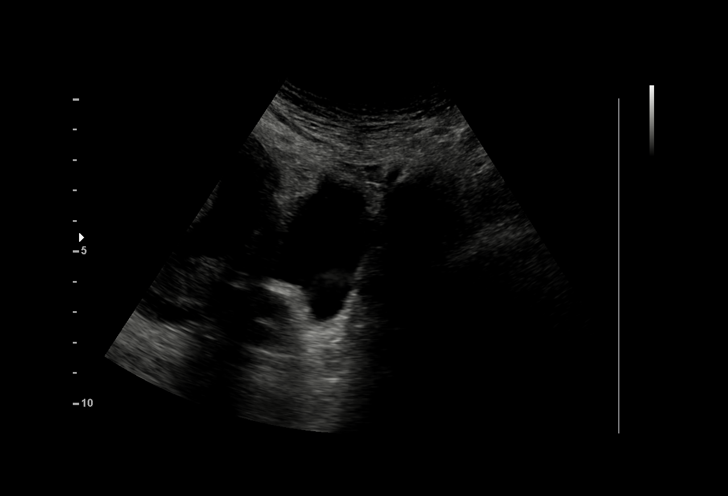

[16 of 25 positions shown; findings below may reference images not displayed]

FINDINGS: Right Kidney:

Length: 11 cm.  Moderate hydronephrosis.  No stones or masses.

Left Kidney:

Length: 10.3 cm. Echogenicity within normal limits. No mass or
hydronephrosis visualized.

Bladder:

Appears normal for degree of bladder distention.
IMPRESSION: 1. There is moderate hydronephrosis on the right. It is possible the
hydronephrosis could be due to the patient's gravid uterus lying on
the right ureter. A ureteral stone is not excluded on this study.

## 2019-04-04 DIAGNOSIS — R2689 Other abnormalities of gait and mobility: Secondary | ICD-10-CM | POA: Diagnosis not present

## 2019-04-04 DIAGNOSIS — R5382 Chronic fatigue, unspecified: Secondary | ICD-10-CM | POA: Diagnosis not present

## 2019-04-04 DIAGNOSIS — E611 Iron deficiency: Secondary | ICD-10-CM | POA: Diagnosis not present

## 2019-04-04 DIAGNOSIS — E538 Deficiency of other specified B group vitamins: Secondary | ICD-10-CM | POA: Diagnosis not present

## 2019-04-13 DIAGNOSIS — Z7189 Other specified counseling: Secondary | ICD-10-CM | POA: Diagnosis not present

## 2019-04-13 DIAGNOSIS — Z03818 Encounter for observation for suspected exposure to other biological agents ruled out: Secondary | ICD-10-CM | POA: Diagnosis not present

## 2019-04-15 DIAGNOSIS — Z20828 Contact with and (suspected) exposure to other viral communicable diseases: Secondary | ICD-10-CM | POA: Diagnosis not present

## 2019-06-15 DIAGNOSIS — Z23 Encounter for immunization: Secondary | ICD-10-CM | POA: Diagnosis not present

## 2019-08-28 DIAGNOSIS — Z130 Encounter for screening for diseases of the blood and blood-forming organs and certain disorders involving the immune mechanism: Secondary | ICD-10-CM | POA: Diagnosis not present

## 2019-08-28 DIAGNOSIS — Z1389 Encounter for screening for other disorder: Secondary | ICD-10-CM | POA: Diagnosis not present

## 2019-08-28 DIAGNOSIS — Z6824 Body mass index (BMI) 24.0-24.9, adult: Secondary | ICD-10-CM | POA: Diagnosis not present

## 2019-08-28 DIAGNOSIS — Z01419 Encounter for gynecological examination (general) (routine) without abnormal findings: Secondary | ICD-10-CM | POA: Diagnosis not present

## 2019-10-17 DIAGNOSIS — R5382 Chronic fatigue, unspecified: Secondary | ICD-10-CM | POA: Diagnosis not present

## 2019-10-17 DIAGNOSIS — E785 Hyperlipidemia, unspecified: Secondary | ICD-10-CM | POA: Diagnosis not present

## 2019-10-17 DIAGNOSIS — Z5181 Encounter for therapeutic drug level monitoring: Secondary | ICD-10-CM | POA: Diagnosis not present

## 2019-10-17 DIAGNOSIS — Z0000 Encounter for general adult medical examination without abnormal findings: Secondary | ICD-10-CM | POA: Diagnosis not present
